# Patient Record
Sex: Female | Born: 1955
Health system: Southern US, Community
[De-identification: ages and names within clinical notes are randomized; demographics above are authoritative.]

## PROBLEM LIST (undated history)

## (undated) DIAGNOSIS — R51 Headache: Secondary | ICD-10-CM

## (undated) DIAGNOSIS — R519 Headache, unspecified: Secondary | ICD-10-CM

## (undated) DIAGNOSIS — M1990 Unspecified osteoarthritis, unspecified site: Secondary | ICD-10-CM

## (undated) DIAGNOSIS — Z809 Family history of malignant neoplasm, unspecified: Secondary | ICD-10-CM

## (undated) DIAGNOSIS — Z808 Family history of malignant neoplasm of other organs or systems: Secondary | ICD-10-CM

## (undated) DIAGNOSIS — M719 Bursopathy, unspecified: Secondary | ICD-10-CM

## (undated) HISTORY — PX: COLONOSCOPY: SHX174

## (undated) HISTORY — PX: AUGMENTATION MAMMAPLASTY: SUR837

## (undated) HISTORY — DX: Family history of malignant neoplasm, unspecified: Z80.9

## (undated) HISTORY — PX: TONSILLECTOMY: SUR1361

## (undated) HISTORY — DX: Family history of malignant neoplasm of other organs or systems: Z80.8

---

## 1983-01-28 HISTORY — PX: BREAST ENHANCEMENT SURGERY: SHX7

## 2008-10-30 ENCOUNTER — Ambulatory Visit: Payer: Self-pay | Admitting: Family Medicine

## 2009-01-27 HISTORY — PX: OTHER SURGICAL HISTORY: SHX169

## 2014-09-08 ENCOUNTER — Ambulatory Visit (INDEPENDENT_AMBULATORY_CARE_PROVIDER_SITE_OTHER): Payer: BC Managed Care – PPO | Admitting: Family Medicine

## 2014-09-08 ENCOUNTER — Encounter: Payer: Self-pay | Admitting: Family Medicine

## 2014-09-08 VITALS — BP 116/55 | HR 84 | Temp 98.6°F | Ht 64.0 in | Wt 127.6 lb

## 2014-09-08 DIAGNOSIS — J329 Chronic sinusitis, unspecified: Secondary | ICD-10-CM | POA: Insufficient documentation

## 2014-09-08 DIAGNOSIS — J209 Acute bronchitis, unspecified: Secondary | ICD-10-CM | POA: Diagnosis not present

## 2014-09-08 DIAGNOSIS — E213 Hyperparathyroidism, unspecified: Secondary | ICD-10-CM | POA: Insufficient documentation

## 2014-09-08 DIAGNOSIS — J45909 Unspecified asthma, uncomplicated: Secondary | ICD-10-CM | POA: Diagnosis not present

## 2014-09-08 MED ORDER — PREDNISONE 5 MG/5ML PO SOLN: 10.0000 mg | Freq: Two times a day (BID) | ORAL | Status: DC

## 2014-09-08 MED ORDER — DOXYCYCLINE CALCIUM 50 MG/5ML PO SYRP: 100.0000 mg | ORAL_SOLUTION | Freq: Two times a day (BID) | ORAL | Status: DC

## 2014-09-08 NOTE — Progress Notes (Signed)
Date:  09/08/2014   Name:  Shelly Thornton   DOB:  [birth year]   MRN:  [id number]  PCP:  Halina Maidens, MD    Chief Complaint: URI   History of Present Illness:  This is a 59 y.o. female with cough productive of yellow phlegm for past 10 days, stuffy nose, ST initially, LG fever yesterday. Gets wheezy with LRI's. Needs liquids as can't swallow pills.  Review of Systems:  Review of Systems  Patient Active Problem List   Diagnosis Date Noted  . Sinus infection 09/08/2014  . HPTH (hyperparathyroidism) 09/08/2014    Prior to Admission medications   Medication Sig Start Date End Date Taking? Authorizing Provider  doxycycline (VIBRAMYCIN) 50 MG/5ML SYRP Take 10 mLs (100 mg total) by mouth 2 (two) times daily. 09/08/14   Adline Potter, MD  predniSONE 5 MG/5ML solution Take 10 mLs (10 mg total) by mouth 2 (two) times daily with a meal. 09/08/14   Adline Potter, MD    Allergies  Allergen Reactions  . Penicillins Rash    Past Surgical History  Procedure Laterality Date  . Parathyroid surgery  2011  . Tonsillectomy      Social History  Substance Use Topics  . Smoking status: Never Smoker   . Smokeless tobacco: Not on file  . Alcohol Use: 0.0 oz/week    0 Standard drinks or equivalent per week    No family history on file.  Medication list has been reviewed and updated.  Physical Examination: BP 116/55 mmHg  Pulse 84  Temp(Src) 98.6 F (37 C)  Ht 5\' 4"  (1.626 m)  Wt 127 lb 9.6 oz (57.879 kg)  BMI 21.89 kg/m2  SpO2 98%  Physical Exam  HENT:  Mouth/Throat: Oropharynx is clear and moist.  Cardiovascular: Normal rate, regular rhythm and normal heart sounds.   Pulmonary/Chest: Effort normal.  B expiratory wheezes, no rales    Assessment and Plan:  1. Bronchitis with asthma, subacute - doxycycline (VIBRAMYCIN) 50 MG/5ML SYRP; Take 10 mLs (100 mg total) by mouth 2 (two) times daily.  Dispense: 200 mL; Refill: 0 - predniSONE 5 MG/5ML solution; Take 10 mLs (10 mg  total) by mouth 2 (two) times daily with a meal.  Dispense: 100 mL; Refill: 0  Return if symptoms worsen or fail to improve.  Satira Anis. [facility]  09/08/2014

## 2015-01-11 ENCOUNTER — Encounter: Payer: Self-pay | Admitting: Internal Medicine

## 2015-02-22 ENCOUNTER — Other Ambulatory Visit: Payer: Self-pay

## 2015-08-08 ENCOUNTER — Other Ambulatory Visit: Payer: Self-pay | Admitting: Internal Medicine

## 2015-08-09 ENCOUNTER — Encounter: Payer: Self-pay | Admitting: Internal Medicine

## 2015-08-09 ENCOUNTER — Ambulatory Visit (INDEPENDENT_AMBULATORY_CARE_PROVIDER_SITE_OTHER): Payer: BC Managed Care – PPO | Admitting: Internal Medicine

## 2015-08-09 VITALS — BP 102/78 | HR 63 | Resp 16 | Ht 64.0 in | Wt 130.5 lb

## 2015-08-09 DIAGNOSIS — M25512 Pain in left shoulder: Secondary | ICD-10-CM | POA: Diagnosis not present

## 2015-08-09 DIAGNOSIS — Z1239 Encounter for other screening for malignant neoplasm of breast: Secondary | ICD-10-CM

## 2015-08-09 DIAGNOSIS — G5603 Carpal tunnel syndrome, bilateral upper limbs: Secondary | ICD-10-CM | POA: Diagnosis not present

## 2015-08-09 NOTE — Progress Notes (Signed)
    Date:  08/09/2015   Name:  Shelly Thornton   DOB:  Feb 28, 1955   MRN:  [id number]   Chief Complaint: Shoulder Pain and Hand Pain Shoulder Pain  The pain is present in the left shoulder. This is a chronic problem. The current episode started more than 1 year ago. The problem has been gradually worsening. The quality of the pain is described as aching. The pain is mild. Associated symptoms include a limited range of motion, numbness (intermittent both wrists) and stiffness. The symptoms are aggravated by lying down and activity. She has tried NSAIDS for the symptoms. The treatment provided moderate relief.  Hand Pain  Associated symptoms include numbness (intermittent both wrists). Pertinent negatives include no chest pain.   Mostly in right wrist - worse with activities, numb at night and often painful.  Grip seems to be decreased.  Left side similar just not as bad.  Splinting helps.   Review of Systems  Constitutional: Negative for chills and fatigue.  Respiratory: Negative for cough, chest tightness and shortness of breath.   Cardiovascular: Negative for chest pain, palpitations and leg swelling.  Musculoskeletal: Positive for myalgias, arthralgias, stiffness and neck stiffness. Negative for joint swelling and gait problem.  Neurological: Positive for numbness (intermittent both wrists).    Patient Active Problem List   Diagnosis Date Noted  . HPTH (hyperparathyroidism) ([place]) 09/08/2014    Prior to Admission medications   Not on File    Allergies  Allergen Reactions  . Penicillins Rash    Past Surgical History  Procedure Laterality Date  . Parathyroid surgery  2011  . Tonsillectomy      Social History  Substance Use Topics  . Smoking status: Never Smoker   . Smokeless tobacco: None  . Alcohol Use: 0.0 oz/week    0 Standard drinks or equivalent per week     Medication list has been reviewed and updated.   Physical Exam  Constitutional: She is oriented to  person, place, and time. She appears well-developed. No distress.  HENT:  Head: Normocephalic and atraumatic.  Cardiovascular: Normal rate, regular rhythm and normal heart sounds.   Pulmonary/Chest: Effort normal and breath sounds normal. No respiratory distress.  Musculoskeletal: She exhibits no edema.       Left shoulder: She exhibits decreased range of motion, tenderness and spasm. She exhibits no effusion and no deformity.  Positive Phalen's sign both wrists  Neurological: She is alert and oriented to person, place, and time. She has normal strength and normal reflexes.  Skin: Skin is warm and dry. No rash noted.  Psychiatric: She has a normal mood and affect. Her behavior is normal. Thought content normal.  Nursing note and vitals reviewed.   BP 102/78 mmHg  Pulse 63  Resp 16  Ht 5\' 4"  (1.626 m)  Wt 130 lb 8 oz (59.194 kg)  BMI 22.39 kg/m2  SpO2 97%  LMP  (Approximate)  Assessment and Plan: 1. Left shoulder pain Continue advil as needed - Ambulatory referral to Orthopedic Surgery  2. Bilateral carpal tunnel syndrome Continue splints as needed Continue modified activities and nsaids Consider referral for cortisone injections  3. Breast cancer screening - MM DIGITAL SCREENING BILATERAL; Future  Pt will schedule CPX.  She is given information regarding cologuard.  Halina Maidens, MD [facility]  08/09/2015

## 2015-08-09 NOTE — Patient Instructions (Signed)
Check on coverage for Cologuard -

## 2015-08-20 ENCOUNTER — Ambulatory Visit: Admission: RE | Admit: 2015-08-20 | Payer: Self-pay | Source: Ambulatory Visit

## 2016-02-07 DIAGNOSIS — G5600 Carpal tunnel syndrome, unspecified upper limb: Secondary | ICD-10-CM | POA: Insufficient documentation

## 2016-02-08 ENCOUNTER — Telehealth: Payer: Self-pay | Admitting: Internal Medicine

## 2016-02-08 ENCOUNTER — Other Ambulatory Visit: Payer: Self-pay | Admitting: Internal Medicine

## 2016-02-08 NOTE — Telephone Encounter (Signed)
Pt called wants to know what type of muscle relaxer prescribe couple years ago.

## 2016-02-08 NOTE — Telephone Encounter (Signed)
Called pt back about the medication Rx. Tizanidine.Marland Kitchen

## 2016-02-08 NOTE — Telephone Encounter (Signed)
The medication was tizanidine 4 mg given in 2013.

## 2016-02-11 ENCOUNTER — Encounter: Payer: Self-pay | Admitting: Internal Medicine

## 2016-02-11 ENCOUNTER — Ambulatory Visit (INDEPENDENT_AMBULATORY_CARE_PROVIDER_SITE_OTHER): Payer: BC Managed Care – PPO | Admitting: Internal Medicine

## 2016-02-11 VITALS — BP 122/84 | HR 57 | Temp 98.7°F | Ht 64.0 in | Wt 140.0 lb

## 2016-02-11 DIAGNOSIS — Z1211 Encounter for screening for malignant neoplasm of colon: Secondary | ICD-10-CM | POA: Diagnosis not present

## 2016-02-11 DIAGNOSIS — B351 Tinea unguium: Secondary | ICD-10-CM | POA: Diagnosis not present

## 2016-02-11 DIAGNOSIS — Z0000 Encounter for general adult medical examination without abnormal findings: Secondary | ICD-10-CM

## 2016-02-11 DIAGNOSIS — Z1159 Encounter for screening for other viral diseases: Secondary | ICD-10-CM | POA: Diagnosis not present

## 2016-02-11 DIAGNOSIS — Z1231 Encounter for screening mammogram for malignant neoplasm of breast: Secondary | ICD-10-CM

## 2016-02-11 DIAGNOSIS — Z1239 Encounter for other screening for malignant neoplasm of breast: Secondary | ICD-10-CM

## 2016-02-11 LAB — POCT URINALYSIS DIPSTICK
BILIRUBIN UA: NEGATIVE
Blood, UA: NEGATIVE
Glucose, UA: NEGATIVE
KETONES UA: NEGATIVE
LEUKOCYTES UA: NEGATIVE
Nitrite, UA: NEGATIVE
Protein, UA: NEGATIVE
Spec Grav, UA: <=1.005
Urobilinogen, UA: 0.2
pH, UA: 6

## 2016-02-11 MED ORDER — CICLOPIROX 8 % EX KIT: 1.0000 "application " | PACK | CUTANEOUS | 5 refills | Status: AC

## 2016-02-11 NOTE — Progress Notes (Addendum)
Date:  02/11/2016   Name:  Shelly Thornton   DOB:  [birth year]   MRN:  [id number]   Chief Complaint: Annual Exam Shelly Thornton is a 61 y.o. female who presents today for her Complete Annual Exam. She feels fairly well. She reports exercising walking. She reports she is sleeping well. She denies breast problems -needs to reschedule mammogram. She has hx of incomplete colonoscopy ~2005.  She has not attempted one since.  This past year her sister was diagnosed with Carcinoid syndrome and is having surgery this month.  She needs to see GI to see what her options are for screening.  Over Christmas she had a day of severe RUQ abd pain with nausea and diarrhea that lasted one day.  It has not recurred.  Review of Systems  Constitutional: Negative for chills, fatigue and fever.  HENT: Negative for congestion, hearing loss, tinnitus, trouble swallowing and voice change.   Eyes: Negative for visual disturbance.  Respiratory: Negative for cough, chest tightness, shortness of breath and wheezing.   Cardiovascular: Negative for chest pain, palpitations and leg swelling.  Gastrointestinal: Negative for abdominal pain, constipation, diarrhea and vomiting.  Endocrine: Negative for polydipsia and polyuria.  Genitourinary: Negative for dysuria, frequency, genital sores, pelvic pain, vaginal bleeding and vaginal discharge.  Musculoskeletal: Positive for back pain. Negative for arthralgias, gait problem and joint swelling.  Skin: Negative for color change and rash.  Neurological: Negative for dizziness, tremors, light-headedness and headaches.  Hematological: Negative for adenopathy. Does not bruise/bleed easily.  Psychiatric/Behavioral: Negative for dysphoric mood and sleep disturbance. The patient is not nervous/anxious.     Patient Active Problem List   Diagnosis Date Noted  . HPTH (hyperparathyroidism) ([place]) 09/08/2014    Prior to Admission medications   Medication Sig Start Date End Date Taking?  Authorizing Provider  meloxicam (MOBIC) 15 MG tablet Take 1 tablet by mouth daily. 01/01/16   Historical Provider, MD  methocarbamol (ROBAXIN) 500 MG tablet  02/07/16   Historical Provider, MD    Allergies  Allergen Reactions  . Penicillins Rash    Past Surgical History:  Procedure Laterality Date  . COLONOSCOPY  ~2005   @ [facility] - incomplete  . parathyroid surgery  2011  . TONSILLECTOMY      Social History  Substance Use Topics  . Smoking status: Never Smoker  . Smokeless tobacco: Never Used  . Alcohol use 0.0 oz/week     Medication list has been reviewed and updated.   Physical Exam  Constitutional: She is oriented to person, place, and time. She appears well-developed and well-nourished. No distress.  HENT:  Head: Normocephalic and atraumatic.  Right Ear: Tympanic membrane and ear canal normal.  Left Ear: Tympanic membrane and ear canal normal.  Nose: Right sinus exhibits no maxillary sinus tenderness. Left sinus exhibits no maxillary sinus tenderness.  Mouth/Throat: Uvula is midline and oropharynx is clear and moist.  Eyes: Conjunctivae and EOM are normal. Right eye exhibits no discharge. Left eye exhibits no discharge. No scleral icterus.  Neck: Normal range of motion. Carotid bruit is not present. No erythema present. No thyromegaly present.  Cardiovascular: Normal rate, regular rhythm, normal heart sounds and normal pulses.   Pulmonary/Chest: Effort normal. No respiratory distress. She has no wheezes. Right breast exhibits no mass, no nipple discharge, no skin change and no tenderness. Left breast exhibits no mass, no nipple discharge, no skin change and no tenderness.  Bilateral breast implants - firm but not hard or tender  Abdominal:  Soft. Bowel sounds are normal. There is no hepatosplenomegaly. There is no tenderness. There is no CVA tenderness.  Genitourinary: Vagina normal and uterus normal. There is no tenderness, lesion or injury on the right labia. There is no  tenderness, lesion or injury on the left labia. Cervix exhibits no motion tenderness and no discharge. Right adnexum displays no mass, no tenderness and no fullness. Left adnexum displays no mass, no tenderness and no fullness.  Musculoskeletal: Normal range of motion.  Lymphadenopathy:    She has no cervical adenopathy.    She has no axillary adenopathy.  Neurological: She is alert and oriented to person, place, and time. She has normal reflexes. No cranial nerve deficit or sensory deficit.  Skin: Skin is warm, dry and intact. No rash noted.  Fungal nail on right great toe  Psychiatric: She has a normal mood and affect. Her speech is normal and behavior is normal. Thought content normal.  Nursing note and vitals reviewed.   BP 122/84   Pulse (!) 57   Temp 98.7 F (37.1 C)   Ht 5\' 4"  (1.626 m)   Wt 140 lb (63.5 kg)   LMP  (Approximate)   SpO2 98%   BMI 24.03 kg/m   Assessment and Plan: 1. Annual physical exam Normal exam - Pap obtained - not need for further Paps unless problems Declines Flu vaccine - CBC with Differential/Platelet - Comprehensive metabolic panel - Lipid panel - TSH - POCT urinalysis dipstick - Pap IG and HPV (high risk) DNA detection  2. Breast cancer screening reschedule - MM DIGITAL SCREENING BILATERAL; Future  3. Need for hepatitis C screening test - Hepatitis C antibody  4. Colon cancer screening - Ambulatory referral to Gastroenterology   Halina Maidens, MD [facility]  02/11/2016

## 2016-02-11 NOTE — Addendum Note (Signed)
Addended by: Glean Hess on: 02/11/2016 09:52 AM   Modules accepted: Orders

## 2016-02-11 NOTE — Patient Instructions (Addendum)
1. What is shingles? Shingles is a painful skin rash, often with blisters. It is also called Herpes Zoster, or just Zoster. A shingles rash usually appears on one side of the face or body and lasts from 2 to 4 weeks. Its main symptom is pain, which can be quite severe. Other symptoms of shingles can include fever, headache, chills and upset stomach. Very rarely, a shingles infection can lead to pneumonia, hearing problems, blindness, brain inflammation (encephalitis) or death. For about 1 person in 5, severe pain can continue even long after the rash clears up. This is called post-herpetic neuralgia. Shingles is caused by the Varicella Zoster virus, the same virus that causes chickenpox. Only someone who has had chickenpox-or, rarely, has gotten chickenpox vaccine-can get shingles. The virus stays in your body, and can cause shingles many years later. You can't catch shingles from another person with shingles. However, a person who has never had chickenpox (or chicken pox vaccine) could get chickenpox from someone with shingles. This is not very common. Shingles is far more common in people 37 years of age and older than in younger people. It is also more common in people whose immune systems are weakened because of a disease such as cancer, or drugs such as steroids or chemotherapy. At least 1 million people a year in the [place] get shingles. 2. Shingles vaccine A vaccine for shingles was licensed in 0947. In clinical trials, the vaccine reduced the risk of shingles by 50%. It can also reduce pain in people who still get shingles after being vaccinated. A single dose of shingles vaccine is recommended for adults 23 years of age and older. 3. Some people should not get shingles vaccine or should wait A person should not get shingles vaccine who:  has ever had a life-threatening allergic reaction to gelatin, the antibiotic neomycin, or any other component of shingles vaccine. Tell your doctor if  you have any severe allergies.  has a weakened immune system because of current:  AIDS or another disease that affects the immune system,  treatment with drugs that affect the immune system, such as prolonged use of high-dose steroids,  cancer treatment such as radiation or chemotherapy,  cancer affecting the bone marrow or lymphatic system, such as leukemia or lymphoma.  is pregnant, or might be pregnant. Women should not become pregnant until at least 4 weeks after getting shingles vaccine. Someone with a minor acute illness, such as a cold, may be vaccinated. But anyone with a moderate or severe acute illness should usually wait until they recover before get ting the vaccine. This includes anyone with a temperature of 101.3 F or higher. 4. What are the risks from shingles vaccine? A vaccine, like any medicine, could possibly cause serious problems, such as severe allergic reactions. However, the risk of a vaccine causing serious harm, or death, is extremely small. No serious problems have been identified with shingles vaccine. Mild problems  Redness, soreness, swelling, or itching at the site of the injection (about 1 person in 3).  Headache (about 1 person in 68). Like all vaccines, shingles vaccine is being closely monitored for unusual or severe problems. 5. What if there is a serious reaction? What should I look for?  Look for anything that concerns you, such as signs of a severe allergic reaction, very high fever, or behavior changes. Signs of a severe allergic reaction can include hives, swelling of the face and throat, difficulty breathing, a fast heartbeat, dizziness, and weakness. These  would start a few minutes to a few hours after the vaccination. What should I do?  If you think it is a severe allergic reaction or other emergency that can't wait, call 9-1-1 or get the person to the nearest hospital. Otherwise, call your doctor.  Afterward, the reaction should be  reported to the Vaccine Adverse Event Reporting System (VAERS). Your doctor might file this report, or you can do it yourself through the VAERS web site at www.vaers.SamedayNews.es or by calling 574-409-2547. VAERS is only for reporting reactions. They do not give medical advice. 6. How can I learn more?  Ask your doctor.  Call your local or state health department.  Contact the Centers for Disease Control and Prevention (CDC):  Call 808-850-4978(1-800-CDC-INFO) or  Visit CDC's website at http://hunter.com/ CDC Vaccine Information Statement (VIS) Shingles Vaccine (11/02/2007) This information is not intended to replace advice given to you by your health care provider. Make sure you discuss any questions you have with your health care provider. Document Released: 11/10/2005 Document Revised: 09/12/2015 Document Reviewed: 05/05/2012 Elsevier Interactive Patient Education  2017 [employer].

## 2016-02-12 LAB — COMPREHENSIVE METABOLIC PANEL
ALBUMIN: 4.6 g/dL (ref 3.6–4.8)
ALT: 19 IU/L (ref 0–32)
AST: 20 IU/L (ref 0–40)
Albumin/Globulin Ratio: 1.9 (ref 1.2–2.2)
Alkaline Phosphatase: 63 IU/L (ref 39–117)
BUN / CREAT RATIO: 21 (ref 12–28)
BUN: 18 mg/dL (ref 8–27)
Bilirubin Total: 0.6 mg/dL (ref 0.0–1.2)
CALCIUM: 9.4 mg/dL (ref 8.7–10.3)
CO2: 28 mmol/L (ref 18–29)
CREATININE: 0.84 mg/dL (ref 0.57–1.00)
Chloride: 97 mmol/L (ref 96–106)
GFR, EST AFRICAN AMERICAN: 86 mL/min/{1.73_m2} (ref >59)
GFR, EST NON AFRICAN AMERICAN: 75 mL/min/{1.73_m2} (ref >59)
GLOBULIN, TOTAL: 2.4 g/dL (ref 1.5–4.5)
Glucose: 88 mg/dL (ref 65–99)
Potassium: 4.3 mmol/L (ref 3.5–5.2)
SODIUM: 140 mmol/L (ref 134–144)
Total Protein: 7 g/dL (ref 6.0–8.5)

## 2016-02-12 LAB — LIPID PANEL
CHOL/HDL RATIO: 2.5 ratio (ref 0.0–4.4)
Cholesterol, Total: 259 mg/dL — ABNORMAL HIGH (ref 100–199)
HDL: 105 mg/dL (ref >39)
LDL CALC: 133 mg/dL — AB (ref 0–99)
Triglycerides: 103 mg/dL (ref 0–149)
VLDL Cholesterol Cal: 21 mg/dL (ref 5–40)

## 2016-02-12 LAB — CBC WITH DIFFERENTIAL/PLATELET
BASOS: 1 %
Basophils Absolute: 0 10*3/uL (ref 0.0–0.2)
EOS (ABSOLUTE): 0.1 10*3/uL (ref 0.0–0.4)
EOS: 1 %
HEMATOCRIT: 40 % (ref 34.0–46.6)
Hemoglobin: 13.6 g/dL (ref 11.1–15.9)
IMMATURE GRANULOCYTES: 0 %
Immature Grans (Abs): 0 10*3/uL (ref 0.0–0.1)
LYMPHS ABS: 2.4 10*3/uL (ref 0.7–3.1)
Lymphs: 33 %
MCH: 31.3 pg (ref 26.6–33.0)
MCHC: 34 g/dL (ref 31.5–35.7)
MCV: 92 fL (ref 79–97)
MONOCYTES: 11 %
MONOS ABS: 0.8 10*3/uL (ref 0.1–0.9)
Neutrophils Absolute: 4.1 10*3/uL (ref 1.4–7.0)
Neutrophils: 54 %
Platelets: 267 10*3/uL (ref 150–379)
RBC: 4.35 x10E6/uL (ref 3.77–5.28)
RDW: 13.8 % (ref 12.3–15.4)
WBC: 7.4 10*3/uL (ref 3.4–10.8)

## 2016-02-12 LAB — HEPATITIS C ANTIBODY: Hep C Virus Ab: <0.1 s/co ratio (ref 0.0–0.9)

## 2016-02-12 LAB — TSH: TSH: 3.49 u[IU]/mL (ref 0.450–4.500)

## 2016-02-13 LAB — PAP IG AND HPV HIGH-RISK
HPV, high-risk: POSITIVE — AB
PAP Smear Comment: 0

## 2016-02-14 ENCOUNTER — Encounter: Payer: Self-pay | Admitting: Internal Medicine

## 2016-02-14 DIAGNOSIS — B977 Papillomavirus as the cause of diseases classified elsewhere: Secondary | ICD-10-CM | POA: Insufficient documentation

## 2016-02-28 ENCOUNTER — Ambulatory Visit: Payer: BC Managed Care – PPO | Admitting: Gastroenterology

## 2016-02-28 ENCOUNTER — Ambulatory Visit (INDEPENDENT_AMBULATORY_CARE_PROVIDER_SITE_OTHER): Payer: BC Managed Care – PPO | Admitting: Gastroenterology

## 2016-02-28 ENCOUNTER — Encounter: Payer: Self-pay | Admitting: Gastroenterology

## 2016-02-28 ENCOUNTER — Other Ambulatory Visit: Payer: Self-pay

## 2016-02-28 VITALS — BP 125/65 | HR 66 | Temp 98.1°F | Ht 64.0 in | Wt 142.0 lb

## 2016-02-28 DIAGNOSIS — K580 Irritable bowel syndrome with diarrhea: Secondary | ICD-10-CM

## 2016-02-28 NOTE — Progress Notes (Signed)
Gastroenterology Consultation  Referring Provider:     Glean Hess, MD Primary Care Physician:  Halina Maidens, MD Primary Gastroenterologist:  Dr. Allen Norris     Reason for Consultation:     Change in bowel habits        HPI:   Shelly Thornton is a 61 y.o. y/o female referred for consultation & management of Change in bowel habits by Dr. Halina Maidens, MD.  This patient comes today with a long-standing history of bloating with abdominal discomfort and diarrhea. The patient states that she has had 2 attempts at colonoscopy in the past. The patient's first attempt was under no sedation of the patient told them to stop because it was too painful. The patient then underwent a colonoscopy with Versed and Demerol and the extends could not be reached and she was told it was because she has irritable bowel syndrome. The patient has never had a colonoscopy since. The patient does have a sister with metastatic carcinoid cancer. She has recently visited her sister who has had surgery to remove the primary tumor but has liver metastases. She reports that she has episodes of having normal bowel movements then will have attacks of diarrhea with abdominal pain and bloating. She thought it may be her gallbladder but states that it was not made better or worse with any foods.  History reviewed. No pertinent past medical history.  Past Surgical History:  Procedure Laterality Date  . BREAST ENHANCEMENT SURGERY  1985  . COLONOSCOPY  ~2005   @ [facility] - incomplete  . parathyroid surgery  2011  . TONSILLECTOMY      Prior to Admission medications   Medication Sig Start Date End Date Taking? Authorizing Provider  ciclopirox (PENLAC) 8 % solution apply topically for 1 DAY 02/12/16  Yes Historical Provider, MD  meloxicam (MOBIC) 15 MG tablet  01/01/16   Historical Provider, MD    Family History  Problem Relation Age of Onset  . Thyroid cancer Mother   . CAD Father 62  . Cancer Sister     carcinoid of  intestine     Social History  Substance Use Topics  . Smoking status: Never Smoker  . Smokeless tobacco: Never Used  . Alcohol use 0.0 oz/week    Allergies as of 02/28/2016 - Review Complete 02/28/2016  Allergen Reaction Noted  . Penicillins Rash 09/08/2014    Review of Systems:    All systems reviewed and negative except where noted in HPI.   Physical Exam:  BP 125/65   Pulse 66   Temp 98.1 F (36.7 C) (Oral)   Ht 5\' 4"  (1.626 m)   Wt 142 lb (64.4 kg)   BMI 24.37 kg/m  No LMP recorded. Patient is postmenopausal. Psych:  Alert and cooperative. Normal mood and affect. General:   Alert,  Well-developed, well-nourished, pleasant and cooperative in NAD Head:  Normocephalic and atraumatic. Eyes:  Sclera clear, no icterus.   Conjunctiva pink. Ears:  Normal auditory acuity. Nose:  No deformity, discharge, or lesions. Mouth:  No deformity or lesions,oropharynx pink & moist. Neck:  Supple; no masses or thyromegaly. Lungs:  Respirations even and unlabored.  Clear throughout to auscultation.   No wheezes, crackles, or rhonchi. No acute distress. Heart:  Regular rate and rhythm; no murmurs, clicks, rubs, or gallops. Abdomen:  Normal bowel sounds.  No bruits.  Soft, non-tender and non-distended without masses, hepatosplenomegaly or hernias noted.  No guarding or rebound tenderness.  Negative Carnett sign.  Rectal:  Deferred.  Msk:  Symmetrical without gross deformities.  Good, equal movement & strength bilaterally. Pulses:  Normal pulses noted. Extremities:  No clubbing or edema.  No cyanosis. Neurologic:  Alert and oriented x3;  grossly normal neurologically. Skin:  Intact without significant lesions or rashes.  No jaundice. Lymph Nodes:  No significant cervical adenopathy. Psych:  Alert and cooperative. Normal mood and affect.  Imaging Studies: No results found.  Assessment and Plan:   Shelly Thornton is a 61 y.o. y/o female who comes in today with a history of irritable  bowel syndrome and symptoms consistent with that. The patient has been told to increase fiber in her diet. This has been a long-standing problem and we have discussed its etiology and treatment. The patient also has never had a complete screening colonoscopy and will be set up for a colonoscopy. I have discussed risks & benefits which include, but are not limited to, bleeding, infection, perforation & drug reaction.  The patient agrees with this plan & written consent will be obtained.       Lucilla Lame, MD. [facility]   Note: This dictation was prepared with Dragon dictation along with smaller phrase technology. Any transcriptional errors that result from this process are unintentional.

## 2016-03-04 NOTE — Discharge Instructions (Signed)

## 2016-03-06 ENCOUNTER — Encounter
Admission: RE | Disposition: A | Discharge status: Home / Self Care | Payer: Self-pay | Source: Ambulatory Visit | Attending: Gastroenterology

## 2016-03-06 ENCOUNTER — Ambulatory Visit: Payer: BC Managed Care – PPO | Admitting: Anesthesiology

## 2016-03-06 ENCOUNTER — Ambulatory Visit
Admission: RE | Admit: 2016-03-06 | Discharge: 2016-03-06 | Disposition: A | Discharge status: Home / Self Care | Payer: BC Managed Care – PPO | Source: Ambulatory Visit | Attending: Gastroenterology | Admitting: Gastroenterology

## 2016-03-06 DIAGNOSIS — D124 Benign neoplasm of descending colon: Secondary | ICD-10-CM | POA: Insufficient documentation

## 2016-03-06 DIAGNOSIS — R51 Headache: Secondary | ICD-10-CM | POA: Diagnosis not present

## 2016-03-06 DIAGNOSIS — K640 First degree hemorrhoids: Secondary | ICD-10-CM | POA: Diagnosis not present

## 2016-03-06 DIAGNOSIS — D123 Benign neoplasm of transverse colon: Secondary | ICD-10-CM | POA: Insufficient documentation

## 2016-03-06 DIAGNOSIS — K5730 Diverticulosis of large intestine without perforation or abscess without bleeding: Secondary | ICD-10-CM | POA: Insufficient documentation

## 2016-03-06 DIAGNOSIS — Z87891 Personal history of nicotine dependence: Secondary | ICD-10-CM | POA: Insufficient documentation

## 2016-03-06 DIAGNOSIS — Z1211 Encounter for screening for malignant neoplasm of colon: Secondary | ICD-10-CM | POA: Diagnosis not present

## 2016-03-06 HISTORY — PX: POLYPECTOMY: SHX149

## 2016-03-06 HISTORY — DX: Headache, unspecified: R51.9

## 2016-03-06 HISTORY — DX: Headache: R51

## 2016-03-06 HISTORY — PX: COLONOSCOPY WITH PROPOFOL: SHX5780

## 2016-03-06 HISTORY — DX: Bursopathy, unspecified: M71.9

## 2016-03-06 SURGERY — COLONOSCOPY WITH PROPOFOL
Anesthesia: Monitor Anesthesia Care | Wound class: Contaminated

## 2016-03-06 MED ORDER — ACETAMINOPHEN 325 MG PO TABS: 325.0000 mg | ORAL_TABLET | ORAL | Status: DC | PRN

## 2016-03-06 MED ORDER — LACTATED RINGERS IV SOLN
INTRAVENOUS | Status: DC
  Administered 2016-03-06: 08:00:00 via INTRAVENOUS

## 2016-03-06 MED ORDER — EPHEDRINE SULFATE 50 MG/ML IJ SOLN
INTRAMUSCULAR | Status: DC | PRN
  Administered 2016-03-06: 5 mg via INTRAVENOUS

## 2016-03-06 MED ORDER — PROPOFOL 10 MG/ML IV BOLUS
INTRAVENOUS | Status: DC | PRN
  Administered 2016-03-06 (×2): 30 mg via INTRAVENOUS
  Administered 2016-03-06: 40 mg via INTRAVENOUS
  Administered 2016-03-06: 100 mg via INTRAVENOUS

## 2016-03-06 MED ORDER — LIDOCAINE HCL (CARDIAC) 20 MG/ML IV SOLN
INTRAVENOUS | Status: DC | PRN
  Administered 2016-03-06: 50 mg via INTRAVENOUS

## 2016-03-06 MED ORDER — ACETAMINOPHEN 160 MG/5ML PO SOLN: 325.0000 mg | ORAL | Status: DC | PRN

## 2016-03-06 SURGICAL SUPPLY — 23 items

## 2016-03-06 NOTE — Op Note (Signed)
[facility] Patient Name: Shelly Thornton Procedure Date: 03/06/2016 8:18 AM MRN: [id number] Account #: [id number] Date of Birth: [birth year] Admit Type: Outpatient Age: 61 Room: [facility] OR ROOM 01 Gender: Female Note Status: Finalized Procedure:            Colonoscopy Indications:          Screening for colorectal malignant neoplasm Providers:            Lucilla Lame MD, MD Referring MD:         Halina Maidens, MD (Referring MD) Medicines:            Propofol per Anesthesia Complications:        No immediate complications. Procedure:            Pre-Anesthesia Assessment:                       - Prior to the procedure, a History and Physical was                        performed, and patient medications and allergies were                        reviewed. The patient's tolerance of previous                        anesthesia was also reviewed. The risks and benefits of                        the procedure and the sedation options and risks were                        discussed with the patient. All questions were                        answered, and informed consent was obtained. Prior                        Anticoagulants: The patient has taken no previous                        anticoagulant or antiplatelet agents. ASA Grade                        Assessment: II - A patient with mild systemic disease.                        After reviewing the risks and benefits, the patient was                        deemed in satisfactory condition to undergo the                        procedure.                       After obtaining informed consent, the colonoscope was                        passed under direct vision. Throughout the procedure,  the patient's blood pressure, pulse, and oxygen                        saturations were monitored continuously. The Olympus                        PCF H180AL Colonoscope (S#: [id number]) was introduced                     through the anus and advanced to the the cecum,                        identified by appendiceal orifice and ileocecal valve.                        The colonoscopy was performed without difficulty. The                        patient tolerated the procedure well. The quality of                        the bowel preparation was excellent. Findings:      The perianal and digital rectal examinations were normal.      A 4 mm polyp was found in the transverse colon. The polyp was sessile.       The polyp was removed with a cold biopsy forceps. Resection and       retrieval were complete.      Multiple small-mouthed diverticula were found in the sigmoid colon.      Non-bleeding internal hemorrhoids were found during retroflexion. The       hemorrhoids were Grade I (internal hemorrhoids that do not prolapse).      A 3 mm polyp was found in the descending colon. The polyp was sessile.       The polyp was removed with a cold biopsy forceps. Resection and       retrieval were complete. Impression:           - One 4 mm polyp in the transverse colon, removed with                        a cold biopsy forceps. Resected and retrieved.                       - Diverticulosis in the sigmoid colon.                       - Non-bleeding internal hemorrhoids.                       - One 3 mm polyp in the descending colon, removed with                        a cold biopsy forceps. Resected and retrieved. Recommendation:       - Discharge patient to home.                       - Resume previous diet.                       - Continue present medications.                       -  Await pathology results.                       - Repeat colonoscopy in 5 years if polyp adenoma and 10                        years if hyperplastic Procedure Code(s):    --- Professional ---                       [phone], Colonoscopy, flexible; with biopsy, single or                        multiple Diagnosis Code(s):     --- Professional ---                       Z12.11, Encounter for screening for malignant neoplasm                        of colon                       D12.3, Benign neoplasm of transverse colon (hepatic                        flexure or splenic flexure) CPT copyright 2016 American Medical Association. All rights reserved. The codes documented in this report are preliminary and upon coder review may  be revised to meet current compliance requirements. Lucilla Lame MD, MD 03/06/2016 8:46:53 AM This report has been signed electronically. Number of Addenda: 0 Note Initiated On: 03/06/2016 8:18 AM Scope Withdrawal Time: 0 hours 6 minutes 3 seconds  Total Procedure Duration: 0 hours 11 minutes 19 seconds       [facility]

## 2016-03-06 NOTE — Anesthesia Procedure Notes (Signed)
Procedure Name: MAC Date/Time: 03/06/2016 8:26 AM Performed by: Cameron Ali Pre-anesthesia Checklist: Patient identified, Emergency Drugs available, Suction available, Timeout performed and Patient being monitored Patient Re-evaluated:Patient Re-evaluated prior to inductionOxygen Delivery Method: Nasal cannula Placement Confirmation: positive ETCO2

## 2016-03-06 NOTE — H&P (Signed)
  Shelly Lame, MD [facility] [address], [place], [place] [address] Phone: [phone] Fax : [phone]  Primary Care Physician:  Halina Maidens, MD Primary Gastroenterologist:  Dr. Allen Norris  Pre-Procedure History & Physical: HPI:  Shelly Thornton is a 61 y.o. female is here for a screening colonoscopy.   Past Medical History:  Diagnosis Date  . Bursitis    left shoulder and right wrist  . Headache    occasional due muscle in shoulders possible migraine    Past Surgical History:  Procedure Laterality Date  . BREAST ENHANCEMENT SURGERY  1985  . COLONOSCOPY  ~2005   @ [facility] - incomplete  . parathyroid surgery  2011  . TONSILLECTOMY      Prior to Admission medications   Medication Sig Start Date End Date Taking? Authorizing Provider  ciclopirox (PENLAC) 8 % solution apply topically for 1 DAY 02/12/16   Historical Provider, MD  meloxicam (MOBIC) 15 MG tablet  01/01/16   Historical Provider, MD    Allergies as of 02/28/2016 - Review Complete 02/28/2016  Allergen Reaction Noted  . Penicillins Rash 09/08/2014    Family History  Problem Relation Age of Onset  . Thyroid cancer Mother   . CAD Father 37  . Cancer Sister     carcinoid of intestine    Social History   Social History  . Marital status: Married    Spouse name: N/A  . Number of children: N/A  . Years of education: N/A   Occupational History  . Not on file.   Social History Main Topics  . Smoking status: Former Smoker    Packs/day: 1.00    Years: 10.00    Types: Cigarettes  . Smokeless tobacco: Never Used  . Alcohol use 0.0 oz/week     Comment: socially  . Drug use: No  . Sexual activity: Not on file   Other Topics Concern  . Not on file   Social History Narrative  . No narrative on file    Review of Systems: See HPI, otherwise negative ROS  Physical Exam: BP 117/67   Pulse 77   Temp 97.3 F (36.3 C) (Temporal)   Ht 5\' 4"  (1.626 m)   Wt 133 lb (60.3 kg)   SpO2 98%   BMI 22.83 kg/m   General:   Alert,  pleasant and cooperative in NAD Head:  Normocephalic and atraumatic. Neck:  Supple; no masses or thyromegaly. Lungs:  Clear throughout to auscultation.    Heart:  Regular rate and rhythm. Abdomen:  Soft, nontender and nondistended. Normal bowel sounds, without guarding, and without rebound.   Neurologic:  Alert and  oriented x4;  grossly normal neurologically.  Impression/Plan: Shelly Thornton is now here to undergo a screening colonoscopy.  Risks, benefits, and alternatives regarding colonoscopy have been reviewed with the patient.  Questions have been answered.  All parties agreeable.

## 2016-03-06 NOTE — Anesthesia Postprocedure Evaluation (Signed)
Anesthesia Post Note  Patient: Shelly Thornton  Procedure(s) Performed: Procedure(s) (LRB): COLONOSCOPY WITH biopsy (N/A) POLYPECTOMY INTESTINAL  Patient location during evaluation: PACU Anesthesia Type: MAC Level of consciousness: awake and alert Pain management: pain level controlled Vital Signs Assessment: post-procedure vital signs reviewed and stable Respiratory status: spontaneous breathing, nonlabored ventilation, respiratory function stable and patient connected to nasal cannula oxygen Cardiovascular status: stable and blood pressure returned to baseline Anesthetic complications: no    Trecia Rogers

## 2016-03-06 NOTE — Transfer of Care (Signed)
Immediate Anesthesia Transfer of Care Note  Patient: Shelly Thornton  Procedure(s) Performed: Procedure(s) with comments: COLONOSCOPY WITH biopsy (N/A) POLYPECTOMY INTESTINAL - Transverse colon polyp  Patient Location: PACU  Anesthesia Type: MAC  Level of Consciousness: awake, alert  and patient cooperative  Airway and Oxygen Therapy: Patient Spontanous Breathing and Patient connected to supplemental oxygen  Post-op Assessment: Post-op Vital signs reviewed, Patient's Cardiovascular Status Stable, Respiratory Function Stable, Patent Airway and No signs of Nausea or vomiting  Post-op Vital Signs: Reviewed and stable  Complications: No apparent anesthesia complications

## 2016-03-06 NOTE — Anesthesia Preprocedure Evaluation (Signed)
Anesthesia Evaluation  Patient identified by MRN, date of birth, ID band Patient awake    Reviewed: Allergy & Precautions, H&P , NPO status , Patient's Chart, lab work & pertinent test results, reviewed documented beta blocker date and time   Airway Mallampati: II  TM Distance: >3 FB Neck ROM: full    Dental no notable dental hx.    Pulmonary former smoker,    Pulmonary exam normal breath sounds clear to auscultation       Cardiovascular Exercise Tolerance: Good negative cardio ROS Normal cardiovascular exam Rhythm:regular Rate:Normal     Neuro/Psych  Headaches, negative psych ROS   GI/Hepatic negative GI ROS, Neg liver ROS,   Endo/Other  negative endocrine ROS  Renal/GU negative Renal ROS  negative genitourinary   Musculoskeletal   Abdominal   Peds  Hematology negative hematology ROS (+)   Anesthesia Other Findings   Reproductive/Obstetrics negative OB ROS                             Anesthesia Physical Anesthesia Plan  ASA: I  Anesthesia Plan: MAC   Post-op Pain Management:    Induction:   Airway Management Planned:   Additional Equipment:   Intra-op Plan:   Post-operative Plan:   Informed Consent: I have reviewed the patients History and Physical, chart, labs and discussed the procedure including the risks, benefits and alternatives for the proposed anesthesia with the patient or authorized representative who has indicated his/her understanding and acceptance.   Dental Advisory Given  Plan Discussed with: CRNA  Anesthesia Plan Comments:         Anesthesia Quick Evaluation

## 2016-03-07 ENCOUNTER — Encounter: Payer: Self-pay | Admitting: Gastroenterology

## 2016-03-09 ENCOUNTER — Encounter: Payer: Self-pay | Admitting: Gastroenterology

## 2016-03-10 ENCOUNTER — Encounter: Payer: Self-pay | Admitting: Internal Medicine

## 2016-03-10 DIAGNOSIS — D126 Benign neoplasm of colon, unspecified: Secondary | ICD-10-CM | POA: Insufficient documentation

## 2016-09-17 ENCOUNTER — Ambulatory Visit: Payer: BC Managed Care – PPO | Admitting: Podiatry

## 2017-01-28 ENCOUNTER — Other Ambulatory Visit: Payer: Self-pay | Admitting: Internal Medicine

## 2017-01-28 ENCOUNTER — Ambulatory Visit: Payer: BC Managed Care – PPO | Admitting: Internal Medicine

## 2017-01-28 ENCOUNTER — Encounter: Payer: Self-pay | Admitting: Internal Medicine

## 2017-01-28 VITALS — BP 112/64 | HR 72 | Temp 97.7°F | Ht 64.0 in | Wt 140.0 lb

## 2017-01-28 DIAGNOSIS — J40 Bronchitis, not specified as acute or chronic: Secondary | ICD-10-CM | POA: Diagnosis not present

## 2017-01-28 MED ORDER — DOXYCYCLINE HYCLATE 100 MG PO TABS: 100.0000 mg | ORAL_TABLET | Freq: Two times a day (BID) | ORAL | 0 refills | Status: AC

## 2017-01-28 MED ORDER — ALBUTEROL SULFATE HFA 108 (90 BASE) MCG/ACT IN AERS: 2.0000 | INHALATION_SPRAY | Freq: Four times a day (QID) | RESPIRATORY_TRACT | 0 refills | Status: DC | PRN

## 2017-01-28 NOTE — Progress Notes (Signed)
Date:  01/28/2017   Name:  Shelly Thornton   DOB:  [birth year]   MRN:  [id number]   Chief Complaint: Generalized Body Aches (Started Christmas- started with cough- some on and off green mucous. Overall body aches, headaches,and fever yesterday. ) and Breast Mass (Left Armpit- noticed 2 days ago. Sore when coughing. )  Cough  This is a new problem. The current episode started in the past 7 days. The problem has been unchanged. The problem occurs every few minutes. The cough is productive of sputum. Associated symptoms include shortness of breath and wheezing. Pertinent negatives include no chest pain, chills, fever, headaches or sore throat. She has tried a beta-agonist inhaler and OTC cough suppressant for the symptoms. The treatment provided moderate relief.   Axillary lump - tender under left arm - mostly with cough but thinks there might be a lump.   Review of Systems  Constitutional: Negative for chills and fever.  HENT: Positive for sinus pressure. Negative for congestion, sore throat and trouble swallowing.   Respiratory: Positive for cough, shortness of breath and wheezing.   Cardiovascular: Negative for chest pain, palpitations and leg swelling.  Gastrointestinal: Negative for diarrhea, nausea and vomiting.  Musculoskeletal: Negative for arthralgias.  Neurological: Negative for dizziness, light-headedness and headaches.  Psychiatric/Behavioral: Negative for sleep disturbance.    Patient Active Problem List   Diagnosis Date Noted  . Adenomatous colon polyp 03/10/2016  . HPV (human papilloma virus) infection 02/14/2016  . HPTH (hyperparathyroidism) ([place]) 09/08/2014    Prior to Admission medications   Medication Sig Start Date End Date Taking? Authorizing Provider  albuterol (PROVENTIL HFA;VENTOLIN HFA) 108 (90 Base) MCG/ACT inhaler Inhale into the lungs every 6 (six) hours as needed for wheezing or shortness of breath.   Yes [provider]  meloxicam (MOBIC) 15 MG  tablet  01/01/16  Yes [provider]    Allergies  Allergen Reactions  . Penicillins Rash    Past Surgical History:  Procedure Laterality Date  . BREAST ENHANCEMENT SURGERY  1985  . COLONOSCOPY  ~2005   @ [facility] - incomplete  . COLONOSCOPY WITH PROPOFOL N/A 03/06/2016   Procedure: COLONOSCOPY WITH biopsy;  Surgeon: Lucilla Lame, MD;  Location: [place];  Service: Endoscopy;  Laterality: N/A;  . parathyroid surgery  2011  . POLYPECTOMY  03/06/2016   Procedure: POLYPECTOMY INTESTINAL;  Surgeon: Lucilla Lame, MD;  Location: [place];  Service: Endoscopy;;  Transverse colon polyp  . TONSILLECTOMY      Social History   Tobacco Use  . Smoking status: Former Smoker    Packs/day: 1.00    Years: 10.00    Pack years: 10.00    Types: Cigarettes  . Smokeless tobacco: Never Used  Substance Use Topics  . Alcohol use: Yes    Alcohol/week: 0.0 oz    Comment: socially  . Drug use: No     Medication list has been reviewed and updated.  PHQ 2/9 Scores 02/11/2016  PHQ - 2 Score 0    Physical Exam  Constitutional: She is oriented to person, place, and time. She appears well-developed. No distress.  HENT:  Head: Normocephalic and atraumatic.  Cardiovascular: Normal rate, regular rhythm and normal heart sounds.  Pulmonary/Chest: Effort normal. No respiratory distress. She has no decreased breath sounds (bronchial breath sounds both upper lungs). She has no wheezes. She has no rhonchi.  Musculoskeletal: Normal range of motion.  Lymphadenopathy:    She has no cervical adenopathy.  She has no axillary adenopathy.  Neurological: She is alert and oriented to person, place, and time.  Skin: Skin is warm and dry. No rash noted.  Psychiatric: She has a normal mood and affect. Her behavior is normal. Thought content normal.  Nursing note and vitals reviewed.   BP 112/64   Pulse 72   Temp 97.7 F (36.5 C) (Oral)   Ht 5\' 4"  (1.626 m)   Wt 140 lb (63.5 kg)   SpO2  97%   BMI 24.03 kg/m   Assessment and Plan: 1. Bronchitis Continue Nyquil as needed, fluids - albuterol (PROVENTIL HFA;VENTOLIN HFA) 108 (90 Base) MCG/ACT inhaler; Inhale 2 puffs into the lungs every 6 (six) hours as needed for wheezing or shortness of breath.  Dispense: 1 Inhaler; Refill: 0 - doxycycline (VIBRA-TABS) 100 MG tablet; Take 1 tablet (100 mg total) by mouth 2 (two) times daily for 10 days.  Dispense: 20 tablet; Refill: 0   Meds ordered this encounter  Medications  . albuterol (PROVENTIL HFA;VENTOLIN HFA) 108 (90 Base) MCG/ACT inhaler    Sig: Inhale 2 puffs into the lungs every 6 (six) hours as needed for wheezing or shortness of breath.    Dispense:  1 Inhaler    Refill:  0  . doxycycline (VIBRA-TABS) 100 MG tablet    Sig: Take 1 tablet (100 mg total) by mouth 2 (two) times daily for 10 days.    Dispense:  20 tablet    Refill:  0    Partially dictated using Editor, commissioning. Any errors are unintentional.  Halina Maidens, MD [facility]  01/28/2017

## 2017-02-04 ENCOUNTER — Other Ambulatory Visit: Payer: Self-pay | Admitting: Internal Medicine

## 2017-02-04 DIAGNOSIS — Z1231 Encounter for screening mammogram for malignant neoplasm of breast: Secondary | ICD-10-CM

## 2017-02-12 ENCOUNTER — Ambulatory Visit
Admission: RE | Admit: 2017-02-12 | Discharge: 2017-02-12 | Disposition: A | Discharge status: Home / Self Care | Payer: BC Managed Care – PPO | Source: Ambulatory Visit | Attending: Internal Medicine | Admitting: Internal Medicine

## 2017-02-12 ENCOUNTER — Other Ambulatory Visit: Payer: Self-pay | Admitting: Internal Medicine

## 2017-02-12 DIAGNOSIS — Z1231 Encounter for screening mammogram for malignant neoplasm of breast: Secondary | ICD-10-CM | POA: Insufficient documentation

## 2017-06-25 ENCOUNTER — Ambulatory Visit: Payer: BC Managed Care – PPO | Admitting: Internal Medicine

## 2017-06-25 ENCOUNTER — Encounter: Payer: Self-pay | Admitting: Internal Medicine

## 2017-06-25 VITALS — BP 100/60 | HR 97 | Temp 98.2°F | Resp 16 | Ht 64.0 in | Wt 142.2 lb

## 2017-06-25 DIAGNOSIS — Z23 Encounter for immunization: Secondary | ICD-10-CM | POA: Diagnosis not present

## 2017-06-25 DIAGNOSIS — S61210A Laceration without foreign body of right index finger without damage to nail, initial encounter: Secondary | ICD-10-CM

## 2017-06-25 DIAGNOSIS — L03011 Cellulitis of right finger: Secondary | ICD-10-CM

## 2017-06-25 MED ORDER — CEPHALEXIN 500 MG PO CAPS: 500.0000 mg | ORAL_CAPSULE | Freq: Four times a day (QID) | ORAL | 0 refills | Status: AC

## 2017-06-25 NOTE — Progress Notes (Signed)
Date:  06/25/2017   Name:  Shelly Thornton   DOB:  18-Aug-1955   MRN:  [id number]   Chief Complaint: Wound Infection (right index finger, X 1.5 weeks ) Laceration   The incident occurred more than 1 week ago. The laceration is located on the right hand (index finger PIP joint - on cheese grater 06/10/17). The laceration mechanism was a metal edge. The pain is mild. She reports no foreign bodies present.      Review of Systems  Constitutional: Negative for chills, fatigue and fever.  Respiratory: Negative for chest tightness and shortness of breath.   Cardiovascular: Negative for chest pain.  Musculoskeletal: Positive for arthralgias (hard to bend the index finger due to swelling).  Skin: Positive for color change and wound.    Patient Active Problem List   Diagnosis Date Noted  . Adenomatous colon polyp 03/10/2016  . HPV (human papilloma virus) infection 02/14/2016  . HPTH (hyperparathyroidism) ([place]) 09/08/2014    Prior to Admission medications   Medication Sig Start Date End Date Taking? Authorizing Provider  albuterol (PROVENTIL HFA;VENTOLIN HFA) 108 (90 Base) MCG/ACT inhaler Inhale 2 puffs into the lungs every 6 (six) hours as needed for wheezing or shortness of breath. 01/28/17  Yes Glean Hess, MD  Aspirin-Caffeine [facility] FAST PAIN RELIEF PO) Take by mouth.   Yes [provider]    Allergies  Allergen Reactions  . Penicillins Rash    Past Surgical History:  Procedure Laterality Date  . AUGMENTATION MAMMAPLASTY    . BREAST ENHANCEMENT SURGERY  1985  . COLONOSCOPY  ~2005   @ [facility] - incomplete  . COLONOSCOPY WITH PROPOFOL N/A 03/06/2016   Procedure: COLONOSCOPY WITH biopsy;  Surgeon: Lucilla Lame, MD;  Location: [place];  Service: Endoscopy;  Laterality: N/A;  . parathyroid surgery  2011  . POLYPECTOMY  03/06/2016   Procedure: POLYPECTOMY INTESTINAL;  Surgeon: Lucilla Lame, MD;  Location: [place];  Service: Endoscopy;;  Transverse colon  polyp  . TONSILLECTOMY      Social History   Tobacco Use  . Smoking status: Former Smoker    Packs/day: 1.00    Years: 10.00    Pack years: 10.00    Types: Cigarettes  . Smokeless tobacco: Never Used  Substance Use Topics  . Alcohol use: Yes    Alcohol/week: 0.0 oz    Comment: socially  . Drug use: No     Medication list has been reviewed and updated.  Current Meds  Medication Sig  . albuterol (PROVENTIL HFA;VENTOLIN HFA) 108 (90 Base) MCG/ACT inhaler Inhale 2 puffs into the lungs every 6 (six) hours as needed for wheezing or shortness of breath.  . Aspirin-Caffeine (BC FAST PAIN RELIEF PO) Take by mouth.    PHQ 2/9 Scores 02/11/2016  PHQ - 2 Score 0    Physical Exam  Constitutional: She is oriented to person, place, and time. She appears well-developed. No distress.  HENT:  Head: Normocephalic and atraumatic.  Pulmonary/Chest: Effort normal. No respiratory distress.  Musculoskeletal: Normal range of motion.  Neurological: She is alert and oriented to person, place, and time.  Skin: Skin is warm and dry. No rash noted.     Psychiatric: She has a normal mood and affect. Her behavior is normal. Thought content normal.  Nursing note and vitals reviewed.   BP 100/60   Pulse 97   Temp 98.2 F (36.8 C) (Oral)   Resp 16   Ht 5\' 4"  (1.626 m)  Wt 142 lb 3.2 oz (64.5 kg)   SpO2 (!) 72%   BMI 24.41 kg/m   Assessment and Plan: 1. Cellulitis of finger of right hand Keep area covered to avoid further trauma - cephALEXin (KEFLEX) 500 MG capsule; Take 1 capsule (500 mg total) by mouth 4 (four) times daily for 10 days.  Dispense: 40 capsule; Refill: 0  2. Laceration of right index finger without foreign body without damage to nail, initial encounter Last documented tetanus 2003 - Tdap vaccine greater than or equal to 7yo IM   Meds ordered this encounter  Medications  . cephALEXin (KEFLEX) 500 MG capsule    Sig: Take 1 capsule (500 mg total) by mouth 4 (four)  times daily for 10 days.    Dispense:  40 capsule    Refill:  0    Partially dictated using Editor, commissioning. Any errors are unintentional.  Halina Maidens, MD [facility]  06/25/2017   There are no diagnoses linked to this encounter.

## 2017-06-25 NOTE — Patient Instructions (Signed)
Tdap Vaccine (Tetanus, Diphtheria and Pertussis): What You Need to Know 1. Why get vaccinated? Tetanus, diphtheria and pertussis are very serious diseases. Tdap vaccine can protect us from these diseases. And, Tdap vaccine given to pregnant women can protect newborn babies against pertussis. TETANUS (Lockjaw) is rare in the [place] today. It causes painful muscle tightening and stiffness, usually all over the body.  It can lead to tightening of muscles in the head and neck so you can't open your mouth, swallow, or sometimes even breathe. Tetanus kills about 1 out of 10 people who are infected even after receiving the best medical care.  DIPHTHERIA is also rare in the [place] today. It can cause a thick coating to form in the back of the throat.  It can lead to breathing problems, heart failure, paralysis, and death.  PERTUSSIS (Whooping Cough) causes severe coughing spells, which can cause difficulty breathing, vomiting and disturbed sleep.  It can also lead to weight loss, incontinence, and rib fractures. Up to 2 in 100 adolescents and 5 in 100 adults with pertussis are hospitalized or have complications, which could include pneumonia or death.  These diseases are caused by bacteria. Diphtheria and pertussis are spread from person to person through secretions from coughing or sneezing. Tetanus enters the body through cuts, scratches, or wounds. Before vaccines, as many as 200,000 cases of diphtheria, 200,000 cases of pertussis, and hundreds of cases of tetanus, were reported in the [place] each year. Since vaccination began, reports of cases for tetanus and diphtheria have dropped by about 99% and for pertussis by about 80%. 2. Tdap vaccine Tdap vaccine can protect adolescents and adults from tetanus, diphtheria, and pertussis. One dose of Tdap is routinely given at age 11 or 12. People who did not get Tdap at that age should get it as soon as possible. Tdap is especially  important for healthcare professionals and anyone having close contact with a baby younger than 12 months. Pregnant women should get a dose of Tdap during every pregnancy, to protect the newborn from pertussis. Infants are most at risk for severe, life-threatening complications from pertussis. Another vaccine, called Td, protects against tetanus and diphtheria, but not pertussis. A Td booster should be given every 10 years. Tdap may be given as one of these boosters if you have never gotten Tdap before. Tdap may also be given after a severe cut or burn to prevent tetanus infection. Your doctor or the person giving you the vaccine can give you more information. Tdap may safely be given at the same time as other vaccines. 3. Some people should not get this vaccine  A person who has ever had a life-threatening allergic reaction after a previous dose of any diphtheria, tetanus or pertussis containing vaccine, OR has a severe allergy to any part of this vaccine, should not get Tdap vaccine. Tell the person giving the vaccine about any severe allergies.  Anyone who had coma or long repeated seizures within 7 days after a childhood dose of DTP or DTaP, or a previous dose of Tdap, should not get Tdap, unless a cause other than the vaccine was found. They can still get Td.  Talk to your doctor if you: ? have seizures or another nervous system problem, ? had severe pain or swelling after any vaccine containing diphtheria, tetanus or pertussis, ? ever had a condition called Guillain-Barr Syndrome (GBS), ? aren't feeling well on the day the shot is scheduled. 4. Risks With any medicine, including   vaccines, there is a chance of side effects. These are usually mild and go away on their own. Serious reactions are also possible but are rare. Most people who get Tdap vaccine do not have any problems with it. Mild problems following Tdap: (Did not interfere with activities)  Pain where the shot was given (about  3 in 4 adolescents or 2 in 3 adults)  Redness or swelling where the shot was given (about 1 person in 5)  Mild fever of at least 100.4F (up to about 1 in 25 adolescents or 1 in 100 adults)  Headache (about 3 or 4 people in 10)  Tiredness (about 1 person in 3 or 4)  Nausea, vomiting, diarrhea, stomach ache (up to 1 in 4 adolescents or 1 in 10 adults)  Chills, sore joints (about 1 person in 10)  Body aches (about 1 person in 3 or 4)  Rash, swollen glands (uncommon)  Moderate problems following Tdap: (Interfered with activities, but did not require medical attention)  Pain where the shot was given (up to 1 in 5 or 6)  Redness or swelling where the shot was given (up to about 1 in 16 adolescents or 1 in 12 adults)  Fever over 102F (about 1 in 100 adolescents or 1 in 250 adults)  Headache (about 1 in 7 adolescents or 1 in 10 adults)  Nausea, vomiting, diarrhea, stomach ache (up to 1 or 3 people in 100)  Swelling of the entire arm where the shot was given (up to about 1 in 500).  Severe problems following Tdap: (Unable to perform usual activities; required medical attention)  Swelling, severe pain, bleeding and redness in the arm where the shot was given (rare).  Problems that could happen after any vaccine:  People sometimes faint after a medical procedure, including vaccination. Sitting or lying down for about 15 minutes can help prevent fainting, and injuries caused by a fall. Tell your doctor if you feel dizzy, or have vision changes or ringing in the ears.  Some people get severe pain in the shoulder and have difficulty moving the arm where a shot was given. This happens very rarely.  Any medication can cause a severe allergic reaction. Such reactions from a vaccine are very rare, estimated at fewer than 1 in a million doses, and would happen within a few minutes to a few hours after the vaccination. As with any medicine, there is a very remote chance of a vaccine  causing a serious injury or death. The safety of vaccines is always being monitored. For more information, visit: www.cdc.gov/vaccinesafety/ 5. What if there is a serious problem? What should I look for? Look for anything that concerns you, such as signs of a severe allergic reaction, very high fever, or unusual behavior. Signs of a severe allergic reaction can include hives, swelling of the face and throat, difficulty breathing, a fast heartbeat, dizziness, and weakness. These would usually start a few minutes to a few hours after the vaccination. What should I do?  If you think it is a severe allergic reaction or other emergency that can't wait, call 9-1-1 or get the person to the nearest hospital. Otherwise, call your doctor.  Afterward, the reaction should be reported to the Vaccine Adverse Event Reporting System (VAERS). Your doctor might file this report, or you can do it yourself through the VAERS web site at www.vaers.hhs.gov, or by calling 1-800-822-7967. ? VAERS does not give medical advice. 6. The National Vaccine Injury Compensation Program The National   Vaccine Injury Compensation Program (VICP) is a federal program that was created to compensate people who may have been injured by certain vaccines. Persons who believe they may have been injured by a vaccine can learn about the program and about filing a claim by calling 1-800-338-2382 or visiting the VICP website at www.hrsa.gov/vaccinecompensation. There is a time limit to file a claim for compensation. 7. How can I learn more?  Ask your doctor. He or she can give you the vaccine package insert or suggest other sources of information.  Call your local or state health department.  Contact the Centers for Disease Control and Prevention (CDC): ? Call 1-800-232-4636 (1-800-CDC-INFO) or ? Visit CDC's website at www.cdc.gov/vaccines CDC Tdap Vaccine VIS (03/22/13) This information is not intended to replace advice given to you by your  health care provider. Make sure you discuss any questions you have with your health care provider. Document Released: 07/15/2011 Document Revised: 10/04/2015 Document Reviewed: 10/04/2015 Elsevier Interactive Patient Education  2017 Elsevier Inc.  

## 2017-08-25 ENCOUNTER — Encounter: Payer: Self-pay | Admitting: Internal Medicine

## 2017-08-25 ENCOUNTER — Ambulatory Visit: Payer: BC Managed Care – PPO | Admitting: Internal Medicine

## 2017-08-25 VITALS — BP 124/80 | HR 86 | Temp 98.2°F | Resp 16 | Ht 64.0 in | Wt 136.6 lb

## 2017-08-25 DIAGNOSIS — J40 Bronchitis, not specified as acute or chronic: Secondary | ICD-10-CM

## 2017-08-25 MED ORDER — DOXYCYCLINE HYCLATE 100 MG PO TABS: 100.0000 mg | ORAL_TABLET | Freq: Two times a day (BID) | ORAL | 0 refills | Status: AC

## 2017-08-25 MED ORDER — GUAIFENESIN-CODEINE 100-10 MG/5ML PO SYRP: 5.0000 mL | ORAL_SOLUTION | Freq: Three times a day (TID) | ORAL | 0 refills | Status: AC | PRN

## 2017-08-25 NOTE — Patient Instructions (Signed)
Take Mucinex twice a day 

## 2017-08-25 NOTE — Progress Notes (Signed)
Date:  08/25/2017   Name:  Shelly Thornton   DOB:  [birth year]   MRN:  [id number]   Chief Complaint: Cough (Started 10 days ago. Coughing- green/yellow production. ) Cough  This is a new problem. The current episode started 1 to 4 weeks ago (10 days ago - started as a cold and now settled chest). The problem has been gradually worsening. The problem occurs every few minutes. The cough is productive of sputum. Associated symptoms include shortness of breath, sweats and wheezing. Pertinent negatives include no chills, ear congestion, ear pain, headaches, heartburn or postnasal drip. She has tried a beta-agonist inhaler for the symptoms.  She has a hx of smoking and frequent bronchitis after URI.      Review of Systems  Constitutional: Positive for diaphoresis. Negative for chills, fatigue and unexpected weight change.  HENT: Negative for ear discharge, ear pain, postnasal drip, sinus pressure and trouble swallowing.   Respiratory: Positive for cough, chest tightness, shortness of breath and wheezing.   Cardiovascular: Negative for palpitations.  Gastrointestinal: Negative for diarrhea, heartburn, nausea and vomiting.  Neurological: Negative for dizziness and headaches.  Psychiatric/Behavioral: Positive for sleep disturbance.    Patient Active Problem List   Diagnosis Date Noted  . Adenomatous colon polyp 03/10/2016  . HPV (human papilloma virus) infection 02/14/2016  . HPTH (hyperparathyroidism) ([place]) 09/08/2014    Prior to Admission medications   Medication Sig Start Date End Date Taking? Authorizing Provider  albuterol (PROVENTIL HFA;VENTOLIN HFA) 108 (90 Base) MCG/ACT inhaler Inhale 2 puffs into the lungs every 6 (six) hours as needed for wheezing or shortness of breath. 01/28/17   Glean Hess, MD  Aspirin-Caffeine [facility] FAST PAIN RELIEF PO) Take by mouth.    [provider]    Allergies  Allergen Reactions  . Penicillins Rash    Past Surgical History:  Procedure  Laterality Date  . AUGMENTATION MAMMAPLASTY    . BREAST ENHANCEMENT SURGERY  1985  . COLONOSCOPY  ~2005   @ [facility] - incomplete  . COLONOSCOPY WITH PROPOFOL N/A 03/06/2016   Procedure: COLONOSCOPY WITH biopsy;  Surgeon: Lucilla Lame, MD;  Location: [place];  Service: Endoscopy;  Laterality: N/A;  . parathyroid surgery  2011  . POLYPECTOMY  03/06/2016   Procedure: POLYPECTOMY INTESTINAL;  Surgeon: Lucilla Lame, MD;  Location: [place];  Service: Endoscopy;;  Transverse colon polyp  . TONSILLECTOMY      Social History   Tobacco Use  . Smoking status: Former Smoker    Packs/day: 1.00    Years: 10.00    Pack years: 10.00    Types: Cigarettes  . Smokeless tobacco: Never Used  Substance Use Topics  . Alcohol use: Yes    Alcohol/week: 0.0 oz    Comment: socially  . Drug use: No     Medication list has been reviewed and updated.  Current Meds  Medication Sig  . albuterol (PROVENTIL HFA;VENTOLIN HFA) 108 (90 Base) MCG/ACT inhaler Inhale 2 puffs into the lungs every 6 (six) hours as needed for wheezing or shortness of breath.  . Aspirin-Caffeine (BC FAST PAIN RELIEF PO) Take by mouth.    PHQ 2/9 Scores 06/25/2017 02/11/2016  PHQ - 2 Score 0 0  PHQ- 9 Score 0 -    Physical Exam  Constitutional: She is oriented to person, place, and time. She appears well-developed. No distress.  HENT:  Head: Normocephalic and atraumatic.  Right Ear: Hearing, tympanic membrane and ear canal normal.  Left Ear:  Hearing, tympanic membrane and ear canal normal.  Nose: Right sinus exhibits no maxillary sinus tenderness. Left sinus exhibits no maxillary sinus tenderness.  Mouth/Throat: Oropharynx is clear and moist.  Cardiovascular: Normal rate, regular rhythm and normal heart sounds.  Pulmonary/Chest: Effort normal. No respiratory distress. She has wheezes in the right upper field. She has no rhonchi. She has no rales.  Musculoskeletal: Normal range of motion.  Neurological: She is  alert and oriented to person, place, and time.  Skin: Skin is warm and dry. No rash noted.  Psychiatric: She has a normal mood and affect. Her behavior is normal. Thought content normal.  Nursing note and vitals reviewed.   BP 124/80   Pulse 86   Temp 98.2 F (36.8 C) (Oral)   Resp 16   Ht 5\' 4"  (1.626 m)   Wt 136 lb 9.6 oz (62 kg)   SpO2 99%   BMI 23.45 kg/m   Assessment and Plan: 1. Bronchitis Increase fluids, take otc mucinex, use albuterol qid prn - doxycycline (VIBRA-TABS) 100 MG tablet; Take 1 tablet (100 mg total) by mouth 2 (two) times daily for 10 days.  Dispense: 20 tablet; Refill: 0 - guaiFENesin-codeine (ROBITUSSIN AC) 100-10 MG/5ML syrup; Take 5 mLs by mouth 3 (three) times daily as needed for up to 10 days for cough.  Dispense: 150 mL; Refill: 0   Meds ordered this encounter  Medications  . doxycycline (VIBRA-TABS) 100 MG tablet    Sig: Take 1 tablet (100 mg total) by mouth 2 (two) times daily for 10 days.    Dispense:  20 tablet    Refill:  0  . guaiFENesin-codeine (ROBITUSSIN AC) 100-10 MG/5ML syrup    Sig: Take 5 mLs by mouth 3 (three) times daily as needed for up to 10 days for cough.    Dispense:  150 mL    Refill:  0    Partially dictated using Editor, commissioning. Any errors are unintentional.  Halina Maidens, MD [facility]  08/25/2017

## 2017-08-29 ENCOUNTER — Encounter: Payer: Self-pay | Admitting: Emergency Medicine

## 2017-08-29 ENCOUNTER — Other Ambulatory Visit: Payer: Self-pay

## 2017-08-29 ENCOUNTER — Ambulatory Visit
Admission: EM | Admit: 2017-08-29 | Discharge: 2017-08-29 | Disposition: A | Discharge status: Home / Self Care | Payer: BC Managed Care – PPO | Attending: Family Medicine | Admitting: Family Medicine

## 2017-08-29 ENCOUNTER — Ambulatory Visit: Payer: BC Managed Care – PPO

## 2017-08-29 DIAGNOSIS — R062 Wheezing: Secondary | ICD-10-CM | POA: Diagnosis not present

## 2017-08-29 DIAGNOSIS — R05 Cough: Secondary | ICD-10-CM

## 2017-08-29 DIAGNOSIS — J40 Bronchitis, not specified as acute or chronic: Secondary | ICD-10-CM | POA: Diagnosis not present

## 2017-08-29 MED ORDER — HYDROCOD POLST-CPM POLST ER 10-8 MG/5ML PO SUER: 5.0000 mL | Freq: Every evening | ORAL | 0 refills | Status: DC | PRN

## 2017-08-29 MED ORDER — DEXAMETHASONE SODIUM PHOSPHATE 10 MG/ML IJ SOLN
10.0000 mg | Freq: Once | INTRAMUSCULAR | Status: AC
  Administered 2017-08-29: 10 mg via INTRAMUSCULAR

## 2017-08-29 MED ORDER — PREDNISONE 20 MG PO TABS: 40.0000 mg | ORAL_TABLET | Freq: Every day | ORAL | 0 refills | Status: DC

## 2017-08-29 NOTE — ED Provider Notes (Signed)
[facility]    CSN: [id number] Arrival date & time: 08/29/17  0944     History   Chief Complaint Chief Complaint  Patient presents with  . Cough    HPI Shelly Thornton is a 62 y.o. female presents to the urgent care facility for evaluation of cough x3 weeks.  Patient states 3 weeks ago she developed cough and cold symptoms and 5 days ago was diagnosed with bronchitis.  She was given albuterol and doxycycline and states she has not seen much improvement.  She denies any fevers chills or night sweats.  She feels like she has some shortness of breath due to her coughing.  Coughing is still severe.  She has been using albuterol as prescribed.  She is taking Robitussin with codeine.  Patient denies any nausea vomiting or abdominal pain.  No travel outside the country.  No skin rashes. HPI  Past Medical History:  Diagnosis Date  . Bursitis    left shoulder and right wrist  . Headache    occasional due muscle in shoulders possible migraine    Patient Active Problem List   Diagnosis Date Noted  . Adenomatous colon polyp 03/10/2016  . HPV (human papilloma virus) infection 02/14/2016  . HPTH (hyperparathyroidism) ([place]) 09/08/2014    Past Surgical History:  Procedure Laterality Date  . AUGMENTATION MAMMAPLASTY    . BREAST ENHANCEMENT SURGERY  1985  . COLONOSCOPY  ~2005   @ [facility] - incomplete  . COLONOSCOPY WITH PROPOFOL N/A 03/06/2016   Procedure: COLONOSCOPY WITH biopsy;  Surgeon: Lucilla Lame, MD;  Location: [place];  Service: Endoscopy;  Laterality: N/A;  . parathyroid surgery  2011  . POLYPECTOMY  03/06/2016   Procedure: POLYPECTOMY INTESTINAL;  Surgeon: Lucilla Lame, MD;  Location: [place];  Service: Endoscopy;;  Transverse colon polyp  . TONSILLECTOMY      OB History   None      Home Medications    Prior to Admission medications   Medication Sig Start Date End Date Taking? Authorizing Provider  albuterol (PROVENTIL HFA;VENTOLIN HFA) 108  (90 Base) MCG/ACT inhaler Inhale 2 puffs into the lungs every 6 (six) hours as needed for wheezing or shortness of breath. 01/28/17  Yes Glean Hess, MD  Aspirin-Caffeine [facility] FAST PAIN RELIEF PO) Take by mouth.   Yes [provider]  doxycycline (VIBRA-TABS) 100 MG tablet Take 1 tablet (100 mg total) by mouth 2 (two) times daily for 10 days. 08/25/17 09/04/17 Yes Glean Hess, MD  guaiFENesin-codeine [facility]) 100-10 MG/5ML syrup Take 5 mLs by mouth 3 (three) times daily as needed for up to 10 days for cough. 08/25/17 09/04/17 Yes Glean Hess, MD  chlorpheniramine-HYDROcodone [facility] PENNKINETIC ER) 10-8 MG/5ML SUER Take 5 mLs by mouth at bedtime as needed for cough. 08/29/17   Duanne Guess, PA-C  predniSONE (DELTASONE) 20 MG tablet Take 2 tablets (40 mg total) by mouth daily. 08/29/17   Duanne Guess, PA-C    Family History Family History  Problem Relation Age of Onset  . Thyroid cancer Mother   . CAD Father 65  . Cancer Sister        carcinoid of intestine  . Breast cancer Neg Hx     Social History Social History   Tobacco Use  . Smoking status: Former Smoker    Packs/day: 1.00    Years: 10.00    Pack years: 10.00    Types: Cigarettes  . Smokeless tobacco: Never Used  Substance Use Topics  . Alcohol use: Yes    Alcohol/week: 0.0 oz    Comment: socially  . Drug use: No     Allergies   Penicillins   Review of Systems Review of Systems  Constitutional: Negative for fever.  HENT: Negative for congestion, ear discharge, rhinorrhea, sinus pressure, sinus pain, sore throat, trouble swallowing and voice change.   Respiratory: Positive for cough and wheezing. Negative for shortness of breath and stridor.   Cardiovascular: Negative for chest pain.  Gastrointestinal: Negative for abdominal pain, diarrhea, nausea and vomiting.  Genitourinary: Negative for dysuria, flank pain and pelvic pain.  Musculoskeletal: Negative for back pain and myalgias.    Skin: Negative for rash.  Neurological: Negative for dizziness and headaches.     Physical Exam Triage Vital Signs ED Triage Vitals  Enc Vitals Group     BP 08/29/17 0952 119/62     Pulse Rate 08/29/17 0952 70     Resp 08/29/17 0952 14     Temp 08/29/17 0952 98.3 F (36.8 C)     Temp Source 08/29/17 0952 Oral     SpO2 08/29/17 0952 98 %     Weight 08/29/17 0950 138 lb (62.6 kg)     Height 08/29/17 0950 5\' 4"  (1.626 m)     Head Circumference --      Peak Flow --      Pain Score 08/29/17 0950 0     Pain Loc --      Pain Edu? --      Excl. in [place]? --    No data found.  Updated Vital Signs BP 119/62 (BP Location: Left Arm)   Pulse 70   Temp 98.3 F (36.8 C) (Oral)   Resp 14   Ht 5\' 4"  (1.626 m)   Wt 138 lb (62.6 kg)   SpO2 98%   BMI 23.69 kg/m   Visual Acuity Right Eye Distance:   Left Eye Distance:   Bilateral Distance:    Right Eye Near:   Left Eye Near:    Bilateral Near:     Physical Exam  Constitutional: She is oriented to person, place, and time. She appears well-developed and well-nourished. No distress.  HENT:  Head: Normocephalic and atraumatic.  Right Ear: Hearing, tympanic membrane, external ear and ear canal normal.  Left Ear: Hearing, tympanic membrane, external ear and ear canal normal.  Nose: Rhinorrhea present.  Mouth/Throat: Mucous membranes are normal. No trismus in the jaw. No uvula swelling. Posterior oropharyngeal erythema present. No oropharyngeal exudate, posterior oropharyngeal edema or tonsillar abscesses. No tonsillar exudate.  Eyes: Conjunctivae are normal. Right eye exhibits no discharge. Left eye exhibits no discharge.  Neck: Normal range of motion.  Cardiovascular: Normal rate and regular rhythm.  Pulmonary/Chest: Effort normal and breath sounds normal. No stridor. No respiratory distress. She has no wheezes. She has no rales.  Musculoskeletal: Normal range of motion. She exhibits no deformity.  Lymphadenopathy:    She has no  cervical adenopathy.  Neurological: She is alert and oriented to person, place, and time. She has normal reflexes.  Skin: Skin is warm and dry.  Psychiatric: She has a normal mood and affect. Her behavior is normal. Thought content normal.     UC Treatments / Results  Labs (all labs ordered are listed, but only abnormal results are displayed) Labs Reviewed - No data to display  EKG None  Radiology Dg Chest 2 View  Result Date: 08/29/2017 CLINICAL DATA:  Cough for 3  weeks.  Shortness of breath. EXAM: CHEST - 2 VIEW COMPARISON:  None. FINDINGS: The cardiomediastinal silhouette is within normal limits. Calcified breast implants are noted. The lungs are hyperinflated with mild biapical pleural thickening. No airspace consolidation, edema, pleural effusion, or pneumothorax is identified. There is slight S-shaped thoracic scoliosis with slight reversal of the normal thoracic kyphosis. Aortic atherosclerosis is noted. IMPRESSION: Hyperinflation without evidence of acute cardiopulmonary process. Electronically Signed   By: Logan Bores M.D.   On: 08/29/2017 10:36    Procedures Procedures (including critical care time)  Medications Ordered in UC Medications  dexamethasone (DECADRON) injection 10 mg (10 mg Intramuscular Given 08/29/17 1014)    Initial Impression / Assessment and Plan / UC Course  I have reviewed the triage vital signs and the nursing notes.  Pertinent labs & imaging results that were available during my care of the patient were reviewed by me and considered in my medical decision making (see chart for details).     62 year old female with bronchitis.  She is been using albuterol and Robitussin.  Not seeing much relief.  Chest x-ray showed no signs of pneumonia.  Patient given 10 mg dexamethasone IM to help with inflammation and started on prednisone, 40 mg a day for 5 days.  She will continue with albuterol and she is educated on signs and symptoms return to clinic for. Final  Clinical Impressions(s) / UC Diagnoses   Final diagnoses:  Bronchitis     Discharge Instructions     Please continue with albuterol.  Use Tussionex at nighttime as needed for cough.  Take prednisone as prescribed for 5 days.  If any fevers increasing cough, shortness of breath please return to the clinic.    ED Prescriptions    Medication Sig Dispense Auth. Provider   predniSONE (DELTASONE) 20 MG tablet Take 2 tablets (40 mg total) by mouth daily. 10 tablet Duanne Guess, PA-C   chlorpheniramine-HYDROcodone (TUSSIONEX PENNKINETIC ER) 10-8 MG/5ML SUER Take 5 mLs by mouth at bedtime as needed for cough. 60 mL Renata Caprice       Duanne Guess, [place] 08/29/17 1042

## 2017-08-29 NOTE — Discharge Instructions (Addendum)
Please continue with albuterol.  Use Tussionex at nighttime as needed for cough.  Take prednisone as prescribed for 5 days.  If any fevers increasing cough, shortness of breath please return to the clinic.

## 2017-08-29 NOTE — ED Triage Notes (Signed)
Patient was treated for Bronchitis on July 30 and states that her cough has not improved.

## 2018-09-25 ENCOUNTER — Encounter: Payer: Self-pay | Admitting: Emergency Medicine

## 2018-09-25 ENCOUNTER — Other Ambulatory Visit: Payer: Self-pay

## 2018-09-25 ENCOUNTER — Emergency Department
Admission: EM | Admit: 2018-09-25 | Discharge: 2018-09-25 | Disposition: A | Discharge status: Home / Self Care | Payer: Medicare Other | Attending: Emergency Medicine | Admitting: Emergency Medicine

## 2018-09-25 DIAGNOSIS — S0502XA Injury of conjunctiva and corneal abrasion without foreign body, left eye, initial encounter: Secondary | ICD-10-CM

## 2018-09-25 DIAGNOSIS — S058X2A Other injuries of left eye and orbit, initial encounter: Secondary | ICD-10-CM | POA: Diagnosis present

## 2018-09-25 DIAGNOSIS — Z87891 Personal history of nicotine dependence: Secondary | ICD-10-CM | POA: Diagnosis not present

## 2018-09-25 DIAGNOSIS — X58XXXA Exposure to other specified factors, initial encounter: Secondary | ICD-10-CM | POA: Insufficient documentation

## 2018-09-25 DIAGNOSIS — Y999 Unspecified external cause status: Secondary | ICD-10-CM | POA: Insufficient documentation

## 2018-09-25 DIAGNOSIS — Z79899 Other long term (current) drug therapy: Secondary | ICD-10-CM | POA: Diagnosis not present

## 2018-09-25 DIAGNOSIS — Y939 Activity, unspecified: Secondary | ICD-10-CM | POA: Diagnosis not present

## 2018-09-25 DIAGNOSIS — Y929 Unspecified place or not applicable: Secondary | ICD-10-CM | POA: Insufficient documentation

## 2018-09-25 DIAGNOSIS — Z7982 Long term (current) use of aspirin: Secondary | ICD-10-CM | POA: Insufficient documentation

## 2018-09-25 DIAGNOSIS — E213 Hyperparathyroidism, unspecified: Secondary | ICD-10-CM | POA: Diagnosis not present

## 2018-09-25 MED ORDER — TETRACAINE HCL 0.5 % OP SOLN
2.0000 [drp] | Freq: Once | OPHTHALMIC | Status: AC
  Administered 2018-09-25: 2 [drp] via OPHTHALMIC
  Filled 2018-09-25: qty 4

## 2018-09-25 MED ORDER — GENTAMICIN SULFATE 0.3 % OP SOLN: 1.0000 [drp] | OPHTHALMIC | 0 refills | Status: DC

## 2018-09-25 MED ORDER — FLUORESCEIN SODIUM 1 MG OP STRP
1.0000 | ORAL_STRIP | Freq: Once | OPHTHALMIC | Status: AC
  Administered 2018-09-25: 1 via OPHTHALMIC
  Filled 2018-09-25: qty 1

## 2018-09-25 NOTE — ED Provider Notes (Signed)
[facility] Emergency Department Provider Note   ____________________________________________   First MD Initiated Contact with Patient 09/25/18 [phone]     (approximate)  I have reviewed the triage vital signs and the nursing notes.   HISTORY  Chief Complaint Eye Pain    HPI Shelly Thornton is a 63 y.o. female patient complaining of left eye pain upon a.m. today awakening.  Patient states she feels like there is a foreign body in eye.  Patient the pain increase this morning.  Patient denies vision loss.  Patient states she is light sensitive.  Patient rates pain as a 8/10.  Patient described pain as "achy".  No palliative measures for complaint.         Past Medical History:  Diagnosis Date  . Bursitis    left shoulder and right wrist  . Headache    occasional due muscle in shoulders possible migraine    Patient Active Problem List   Diagnosis Date Noted  . Adenomatous colon polyp 03/10/2016  . HPV (human papilloma virus) infection 02/14/2016  . HPTH (hyperparathyroidism) ([place]) 09/08/2014    Past Surgical History:  Procedure Laterality Date  . AUGMENTATION MAMMAPLASTY    . BREAST ENHANCEMENT SURGERY  1985  . COLONOSCOPY  ~2005   @ [facility] - incomplete  . COLONOSCOPY WITH PROPOFOL N/A 03/06/2016   Procedure: COLONOSCOPY WITH biopsy;  Surgeon: Lucilla Lame, MD;  Location: [place];  Service: Endoscopy;  Laterality: N/A;  . parathyroid surgery  2011  . POLYPECTOMY  03/06/2016   Procedure: POLYPECTOMY INTESTINAL;  Surgeon: Lucilla Lame, MD;  Location: [place];  Service: Endoscopy;;  Transverse colon polyp  . TONSILLECTOMY      Prior to Admission medications   Medication Sig Start Date End Date Taking? Authorizing Provider  albuterol (PROVENTIL HFA;VENTOLIN HFA) 108 (90 Base) MCG/ACT inhaler Inhale 2 puffs into the lungs every 6 (six) hours as needed for wheezing or shortness of breath. 01/28/17   Glean Hess, MD   Aspirin-Caffeine [facility] FAST PAIN RELIEF PO) Take by mouth.    [provider]  chlorpheniramine-HYDROcodone (TUSSIONEX PENNKINETIC ER) 10-8 MG/5ML SUER Take 5 mLs by mouth at bedtime as needed for cough. 08/29/17   Duanne Guess, PA-C  gentamicin (GARAMYCIN) 0.3 % ophthalmic solution Place 1 drop into the left eye every 4 (four) hours. 09/25/18   Sable Feil, PA-C  predniSONE (DELTASONE) 20 MG tablet Take 2 tablets (40 mg total) by mouth daily. 08/29/17   Duanne Guess, PA-C    Allergies Penicillins  Family History  Problem Relation Age of Onset  . Thyroid cancer Mother   . CAD Father 6  . Cancer Sister        carcinoid of intestine  . Breast cancer Neg Hx     Social History Social History   Tobacco Use  . Smoking status: Former Smoker    Packs/day: 1.00    Years: 10.00    Pack years: 10.00    Types: Cigarettes  . Smokeless tobacco: Never Used  Substance Use Topics  . Alcohol use: Yes    Alcohol/week: 0.0 standard drinks    Comment: socially  . Drug use: No    Review of Systems  Constitutional: No fever/chills Eyes: No visual changes.  Foreign body sensation photophobia. ENT: No sore throat. Cardiovascular: Denies chest pain. Respiratory: Denies shortness of breath. Gastrointestinal: No abdominal pain.  No nausea, no vomiting.  No diarrhea.  No constipation. Genitourinary: Negative for dysuria.  Musculoskeletal: Negative for back pain. Skin: Negative for rash. Neurological: Negative for headaches, focal weakness or numbness. Allergic/Immunilogical: Penicillin ____________________________________________   PHYSICAL EXAM:  VITAL SIGNS: ED Triage Vitals  Enc Vitals Group     BP 09/25/18 0410 131/63     Pulse Rate 09/25/18 0410 82     Resp 09/25/18 0410 18     Temp 09/25/18 0410 98.5 F (36.9 C)     Temp Source 09/25/18 0410 Oral     SpO2 09/25/18 0410 98 %     Weight 09/25/18 0411 145 lb (65.8 kg)     Height 09/25/18 0411 5\' 4"  (1.626 m)      Head Circumference --      Peak Flow --      Pain Score 09/25/18 0411 8     Pain Loc --      Pain Edu? --      Excl. in [place]? --    Constitutional: Alert and oriented. Well appearing and in no acute distress. Eyes: Conjunctivae are normal. PERRL. EOMI. fluorescein stain reveals corneal abrasion. Cardiovascular: Normal rate, regular rhythm. Grossly normal heart sounds.  Good peripheral circulation. Respiratory: Normal respiratory effort.  No retractions. Lungs CTAB. Skin:  Skin is warm, dry and intact. No rash noted. Psychiatric: Mood and affect are normal. Speech and behavior are normal.  ____________________________________________   LABS (all labs ordered are listed, but only abnormal results are displayed)  Labs Reviewed - No data to display ____________________________________________  EKG   ____________________________________________  RADIOLOGY  ED MD interpretation:    Official radiology report(s): No results found.  ____________________________________________   PROCEDURES  Procedure(s) performed (including Critical Care):  Procedures   ____________________________________________   INITIAL IMPRESSION / ASSESSMENT AND PLAN / ED COURSE  As part of my medical decision making, I reviewed the following data within the electronic MEDICAL RECORD NUMBER         Shelly Thornton was evaluated in Emergency Department on 09/25/2018 for the symptoms described in the history of present illness. She was evaluated in the context of the global COVID-19 pandemic, which necessitated consideration that the patient might be at risk for infection with the SARS-CoV-2 virus that causes COVID-19. Institutional protocols and algorithms that pertain to the evaluation of patients at risk for COVID-19 are in a state of rapid change based on information released by regulatory bodies including the CDC and federal and state organizations. These policies and algorithms were followed during the  patient's care in the ED.  Patient presents with left eye pain and foreign body sensation.  Physical consistent with corneal abrasion.  Patient given discharge care instructions and a prescription for gentamicin.  Patient advised to follow ophthalmology if no improvement in 2 days.      ____________________________________________   FINAL CLINICAL IMPRESSION(S) / ED DIAGNOSES  Final diagnoses:  Corneal abrasion, left, initial encounter     ED Discharge Orders         Ordered    gentamicin (GARAMYCIN) 0.3 % ophthalmic solution  Every 4 hours     09/25/18 0734           Note:  This document was prepared using Dragon voice recognition software and may include unintentional dictation errors.    Sable Feil, PA-C 09/25/18 [id number]    Duffy Bruce, MD 09/27/18 [phone]

## 2018-09-25 NOTE — ED Notes (Signed)
Pt c/o left eye pain and swelling that began yesterday morning. Pt is unsure is she has scraped or injured her eye in any way.

## 2018-09-25 NOTE — ED Triage Notes (Signed)
Patient states that when she woke up Friday morning she was having irritation in her left eye. Patient states that the pain became worse throughout the day.

## 2019-06-14 IMAGING — MG MM DIGITAL SCREENING IMPLANTS W/ CAD
8 of 10 series · 8 of 10 positions shown · non-contrast
Comparison: None available.

ADDENDUM:
2: Benign.
CLINICAL DATA: Screening.

EXAM:
DIGITAL SCREENING BILATERAL MAMMOGRAM WITH IMPLANTS AND CAD
The patient has retroglandular implants. Standard and implant
displaced views were performed.

[L CC (1 of 2)]
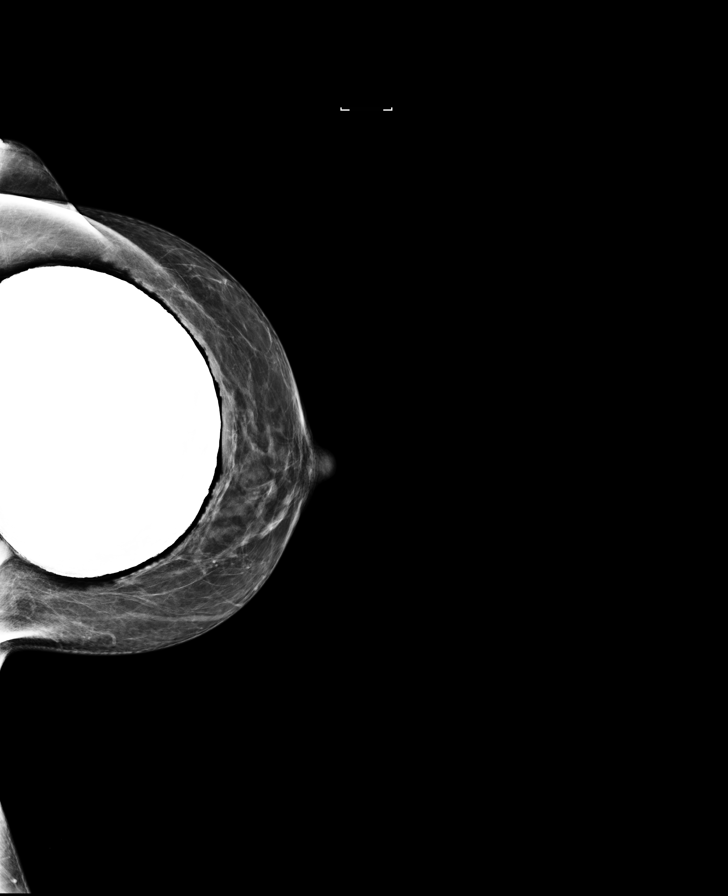

[L MLO (1 of 2)]
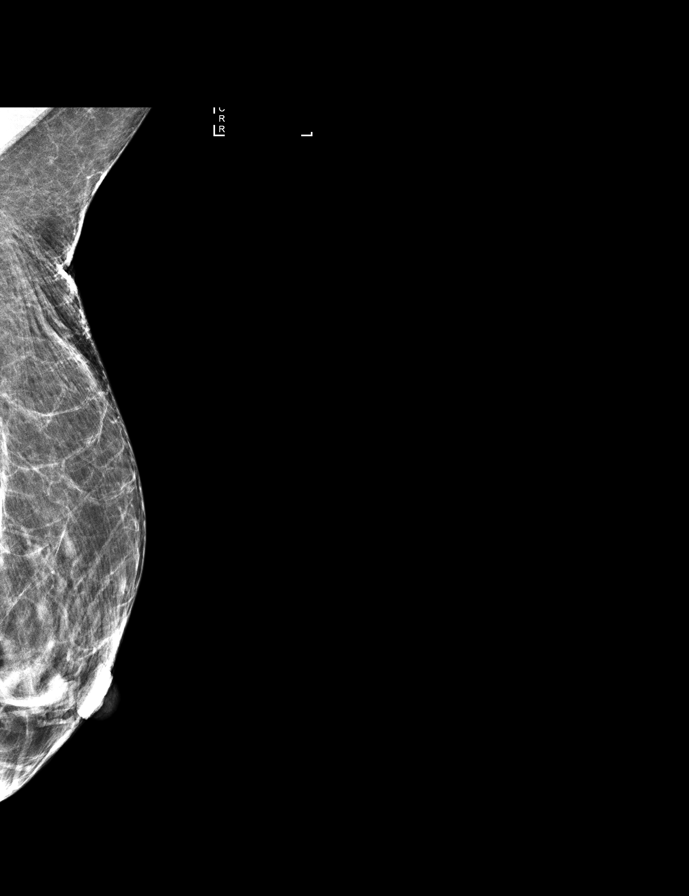

[R MLO (1 of 2)]
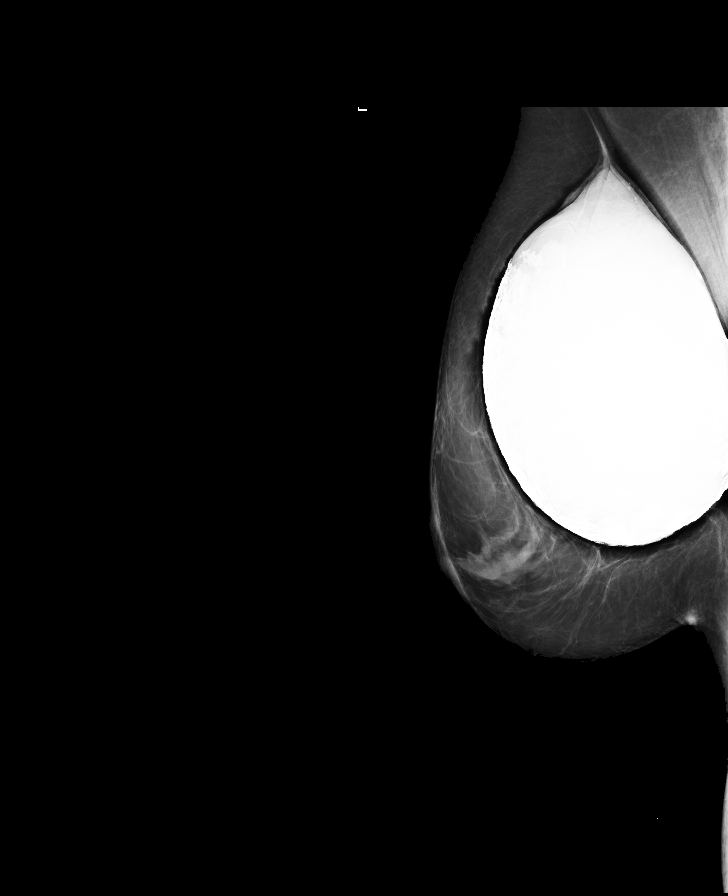

[R CC (1 of 2)]
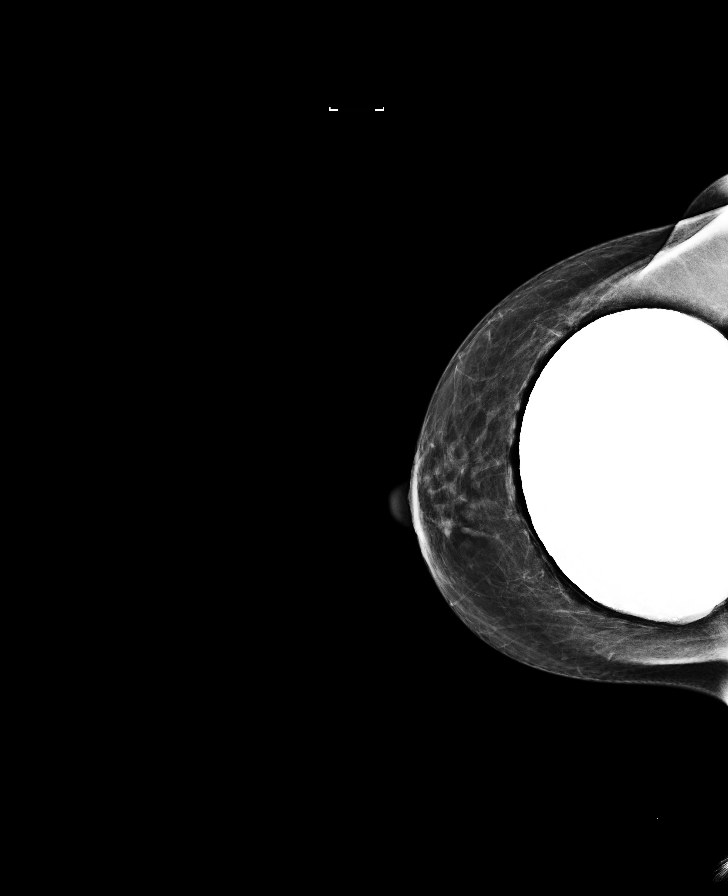

[R MLO (2 of 2)]
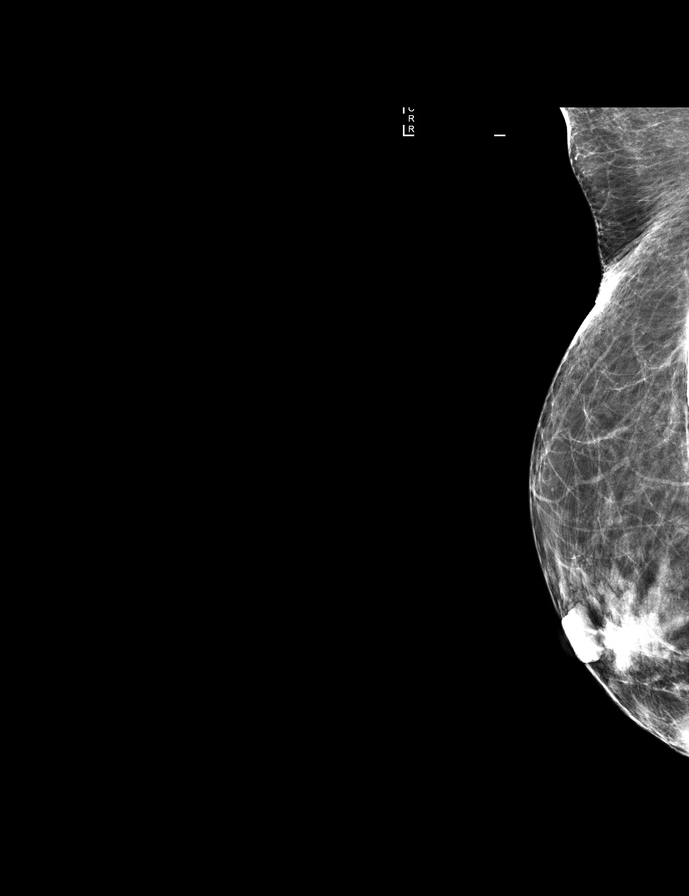

[L MLO (2 of 2)]
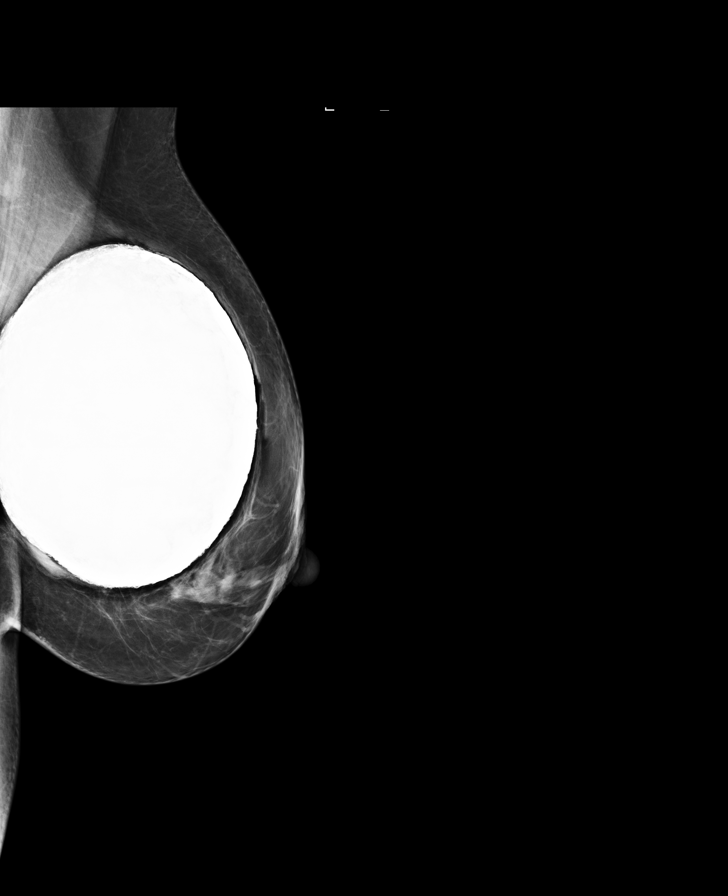

[R CC (2 of 2)]
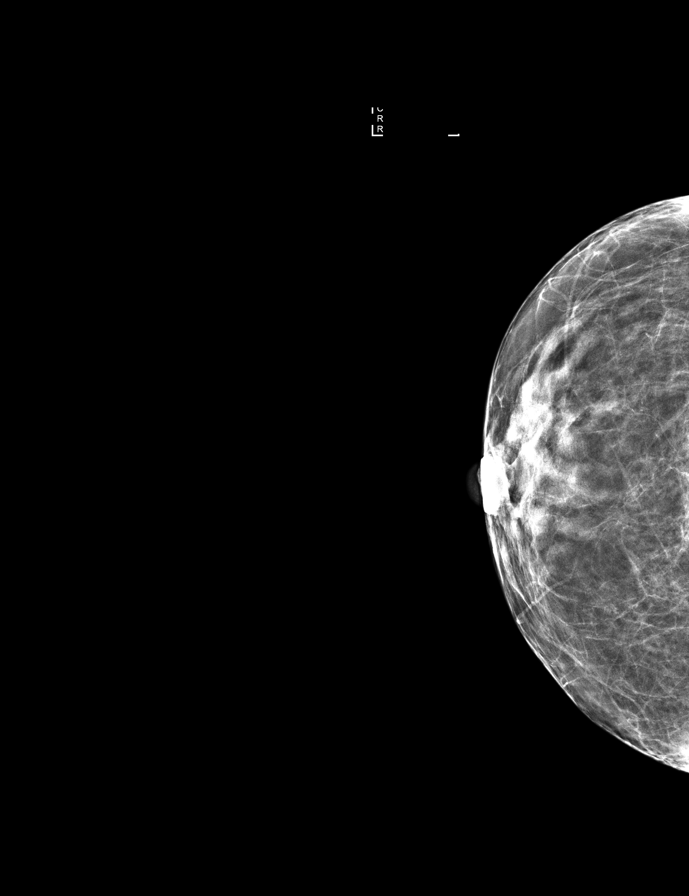

[L CC (2 of 2)]
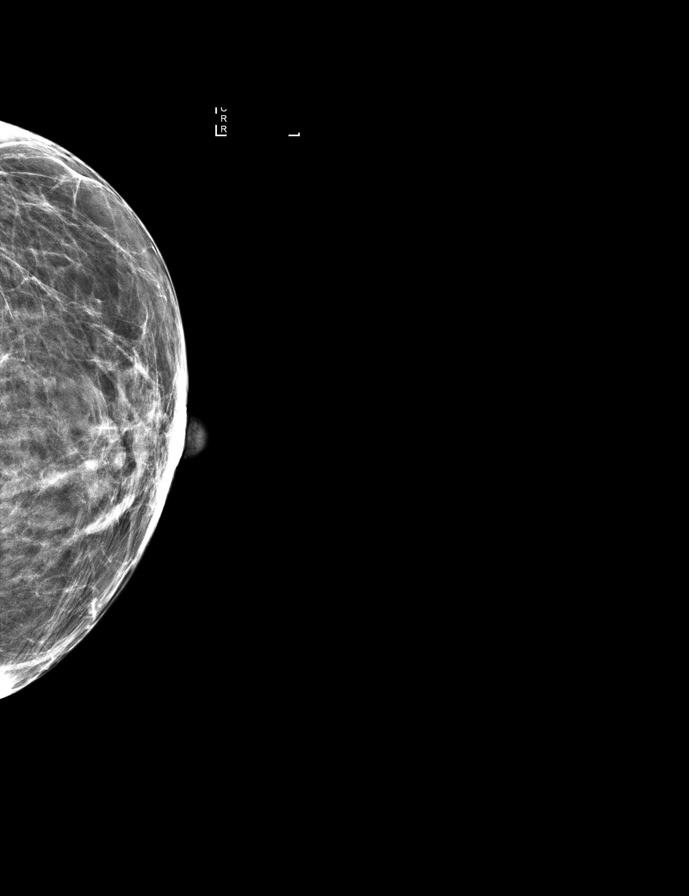

[8 of 10 positions shown; findings below may reference images not displayed]

ACR Breast Density Category c: The breast tissue is heterogeneously
dense, which may obscure small masses.
FINDINGS: There are no findings suspicious for malignancy. There are bilateral
subglandular silicone implants with evidence of extracapsular
rupture on the right. Questionable extracapsular silicone rupture in
the inferior left breast. Images were processed with CAD.
IMPRESSION: No mammographic evidence of malignancy. A result letter of this
screening mammogram will be mailed directly to the patient.

RECOMMENDATION:
Screening mammogram in one year. (Code:[id number]-648)

BI-RADS CATEGORY  No mammographic evidence of breast malignancy.

Bilateral subglandular silicone breast implants with extracapsular
rupture on the right and suspected extracapsular rupture on the
left.

Extracapsular silicone rupture of the right breast implant.

## 2020-02-08 ENCOUNTER — Telehealth: Payer: Self-pay

## 2020-02-08 NOTE — Telephone Encounter (Signed)
Copied from [place] [id number]. Topic: General - Other >> Feb 07, 2020  4:13 PM Rainey Pines A wrote: Patient would like a callback to confirm if pcp prescribes ivermectin. Please advise

## 2020-02-08 NOTE — Telephone Encounter (Signed)
Spoke to pt let her know that Dr. Army Melia does not prescribe ivermectin. Pt verbalized understanding.  KP

## 2020-04-25 ENCOUNTER — Encounter: Payer: Self-pay | Admitting: Internal Medicine

## 2020-04-25 ENCOUNTER — Other Ambulatory Visit: Payer: Self-pay

## 2020-04-25 ENCOUNTER — Ambulatory Visit (INDEPENDENT_AMBULATORY_CARE_PROVIDER_SITE_OTHER): Payer: Medicare PPO | Admitting: Internal Medicine

## 2020-04-25 VITALS — BP 112/78 | HR 64 | Temp 97.9°F | Ht 64.0 in | Wt 134.0 lb

## 2020-04-25 DIAGNOSIS — H6123 Impacted cerumen, bilateral: Secondary | ICD-10-CM | POA: Diagnosis not present

## 2020-04-25 DIAGNOSIS — H6690 Otitis media, unspecified, unspecified ear: Secondary | ICD-10-CM

## 2020-04-25 MED ORDER — AZITHROMYCIN 200 MG/5ML PO SUSR: 10.0000 mg/kg | Freq: Every day | ORAL | 0 refills | Status: AC

## 2020-04-25 NOTE — Progress Notes (Signed)
Date:  04/25/2020   Name:  Shelly Thornton   DOB:  [birth year]   MRN:  [id number]   Chief Complaint: Ear Pain (X 3 weeks, right ear, Face feels warm but no temp, no other symptoms)  Otalgia  There is pain in the right ear. The current episode started 1 to 4 weeks ago. The problem occurs constantly. The problem has been waxing and waning. There has been no fever. The pain is mild (mostly fullness on the right). Pertinent negatives include no abdominal pain, coughing, ear discharge, neck pain, rhinorrhea or sore throat. Treatments tried: antihistamines.    Lab Results  Component Value Date   CREATININE 0.84 02/11/2016   BUN 18 02/11/2016   NA 140 02/11/2016   K 4.3 02/11/2016   CL 97 02/11/2016   CO2 28 02/11/2016   Lab Results  Component Value Date   CHOL 259 (H) 02/11/2016   HDL 105 02/11/2016   LDLCALC 133 (H) 02/11/2016   TRIG 103 02/11/2016   CHOLHDL 2.5 02/11/2016   Lab Results  Component Value Date   TSH 3.490 02/11/2016   No results found for: HGBA1C Lab Results  Component Value Date   WBC 7.4 02/11/2016   HGB 13.6 02/11/2016   HCT 40.0 02/11/2016   MCV 92 02/11/2016   PLT 267 02/11/2016   Lab Results  Component Value Date   ALT 19 02/11/2016   AST 20 02/11/2016   ALKPHOS 63 02/11/2016   BILITOT 0.6 02/11/2016     Review of Systems  Constitutional: Negative for chills, fatigue and fever.  HENT: Positive for congestion, ear pain and tinnitus. Negative for ear discharge, rhinorrhea, sinus pressure, sinus pain, sore throat and trouble swallowing.   Respiratory: Negative for cough and shortness of breath.   Cardiovascular: Negative for chest pain.  Gastrointestinal: Negative for abdominal pain.  Musculoskeletal: Negative for neck pain.  Allergic/Immunologic: Positive for environmental allergies.    Patient Active Problem List   Diagnosis Date Noted  . Adenomatous colon polyp 03/10/2016  . HPV (human papilloma virus) infection 02/14/2016  . HPTH  (hyperparathyroidism) ([place]) 09/08/2014    Allergies  Allergen Reactions  . Penicillins Rash    Past Surgical History:  Procedure Laterality Date  . AUGMENTATION MAMMAPLASTY    . BREAST ENHANCEMENT SURGERY  1985  . COLONOSCOPY  ~2005   @ [facility] - incomplete  . COLONOSCOPY WITH PROPOFOL N/A 03/06/2016   Procedure: COLONOSCOPY WITH biopsy;  Surgeon: Lucilla Lame, MD;  Location: [place];  Service: Endoscopy;  Laterality: N/A;  . parathyroid surgery  2011  . POLYPECTOMY  03/06/2016   Procedure: POLYPECTOMY INTESTINAL;  Surgeon: Lucilla Lame, MD;  Location: [place];  Service: Endoscopy;;  Transverse colon polyp  . TONSILLECTOMY      Social History   Tobacco Use  . Smoking status: Former Smoker    Packs/day: 1.00    Years: 10.00    Pack years: 10.00    Types: Cigarettes  . Smokeless tobacco: Never Used  Vaping Use  . Vaping Use: Never used  Substance Use Topics  . Alcohol use: Yes    Alcohol/week: 0.0 standard drinks    Comment: socially  . Drug use: No     Medication list has been reviewed and updated.  Current Meds  Medication Sig  . albuterol (PROVENTIL HFA;VENTOLIN HFA) 108 (90 Base) MCG/ACT inhaler Inhale 2 puffs into the lungs every 6 (six) hours as needed for wheezing or shortness of breath.  . Aspirin-Caffeine (  BC FAST PAIN RELIEF PO) Take by mouth.    PHQ 2/9 Scores 04/25/2020 06/25/2017 02/11/2016  PHQ - 2 Score 0 0 0  PHQ- 9 Score 0 0 -    GAD 7 : Generalized Anxiety Score 04/25/2020  Nervous, Anxious, on Edge 0  Control/stop worrying 0  Worry too much - different things 0  Trouble relaxing 0  Restless 0  Easily annoyed or irritable 0  Afraid - awful might happen 0  Total GAD 7 Score 0    BP Readings from Last 3 Encounters:  04/25/20 112/78  09/25/18 128/69  08/29/17 119/62    Physical Exam Vitals and nursing note reviewed.  Constitutional:      General: She is not in acute distress.    Appearance: She is well-developed.   HENT:     Head: Normocephalic and atraumatic.     Right Ear: Hearing, ear canal and external ear normal. No decreased hearing noted. No middle ear effusion. There is impacted cerumen. Tympanic membrane is erythematous and retracted.     Left Ear: Hearing, ear canal and external ear normal. No decreased hearing noted.  No middle ear effusion. There is impacted cerumen. Tympanic membrane is erythematous and retracted.     Ears:     Comments: Cerumen filled both ear canals.  A large quantity of cerumen was  removed from both ears - initially with curette then with irrigation then again the curette was needed to removed the final cerumen.   Pt tolerated well with only transient minor dizziness.  Pulmonary:     Effort: Pulmonary effort is normal. No respiratory distress.  Skin:    General: Skin is warm and dry.     Findings: No rash.  Neurological:     Mental Status: She is alert and oriented to person, place, and time.  Psychiatric:        Mood and Affect: Mood normal.        Behavior: Behavior normal.     Wt Readings from Last 3 Encounters:  04/25/20 134 lb (60.8 kg)  09/25/18 145 lb (65.8 kg)  08/29/17 138 lb (62.6 kg)    BP 112/78   Pulse 64   Temp 97.9 F (36.6 C) (Oral)   Ht 5\' 4"  (1.626 m)   Wt 134 lb (60.8 kg)   SpO2 99%   BMI 23.00 kg/m   Assessment and Plan: 1. Acute otitis media, unspecified otitis media type Continue claritin and/or sudafed if helpful for sx relief - azithromycin (ZITHROMAX) 200 MG/5ML suspension; Take 15.2 mLs (608 mg total) by mouth daily for 5 days.  Dispense: 22.5 mL; Refill: 0  2. Impacted cerumen of both ears Use H2O2 - 1 drop in each ear bid for several days   Partially dictated using Editor, commissioning. Any errors are unintentional.  Halina Maidens, MD [facility]  04/25/2020

## 2020-05-29 ENCOUNTER — Encounter: Payer: Self-pay | Admitting: Internal Medicine

## 2020-05-29 ENCOUNTER — Other Ambulatory Visit: Payer: Self-pay

## 2020-05-29 ENCOUNTER — Ambulatory Visit: Payer: Medicare PPO | Admitting: Internal Medicine

## 2020-05-29 VITALS — BP 98/62 | HR 68 | Temp 98.2°F | Ht 64.0 in | Wt 134.0 lb

## 2020-05-29 DIAGNOSIS — R197 Diarrhea, unspecified: Secondary | ICD-10-CM

## 2020-05-29 NOTE — Progress Notes (Signed)
Date:  05/29/2020   Name:  Shelly Thornton   DOB:  May 16, 1955   MRN:  [id number]   Chief Complaint: Diarrhea (X 1 month. No solid stools since March. On and off abdominal tenderness. Diarrhea mostly in the mornings. )  Diarrhea  This is a new (has always tended to have loose stools) problem. The current episode started 1 to 4 weeks ago. The problem occurs 2 to 4 times per day. The problem has been unchanged. The stool consistency is described as watery. The patient states that diarrhea awakens her from sleep. Pertinent negatives include no abdominal pain, chills, fever, headaches, increased  flatus or vomiting. Nothing aggravates the symptoms. Risk factors: she is under significant home and financial stress at this time. She has tried bismuth subsalicylate (has already been on mild probiotics) for the symptoms. The treatment provided mild relief.    Lab Results  Component Value Date   CREATININE 0.84 02/11/2016   BUN 18 02/11/2016   NA 140 02/11/2016   K 4.3 02/11/2016   CL 97 02/11/2016   CO2 28 02/11/2016   Lab Results  Component Value Date   CHOL 259 (H) 02/11/2016   HDL 105 02/11/2016   LDLCALC 133 (H) 02/11/2016   TRIG 103 02/11/2016   CHOLHDL 2.5 02/11/2016   Lab Results  Component Value Date   TSH 3.490 02/11/2016   No results found for: HGBA1C Lab Results  Component Value Date   WBC 7.4 02/11/2016   HGB 13.6 02/11/2016   HCT 40.0 02/11/2016   MCV 92 02/11/2016   PLT 267 02/11/2016   Lab Results  Component Value Date   ALT 19 02/11/2016   AST 20 02/11/2016   ALKPHOS 63 02/11/2016   BILITOT 0.6 02/11/2016     Review of Systems  Constitutional: Negative for chills, diaphoresis, fatigue, fever and unexpected weight change.  Respiratory: Negative for chest tightness and shortness of breath.   Cardiovascular: Negative for chest pain and palpitations.  Gastrointestinal: Positive for diarrhea. Negative for abdominal pain, blood in stool, flatus, nausea, rectal  pain and vomiting.  Neurological: Negative for dizziness and headaches.  Psychiatric/Behavioral: Negative for dysphoric mood and sleep disturbance. The patient is not nervous/anxious.     Patient Active Problem List   Diagnosis Date Noted  . Adenomatous colon polyp 03/10/2016  . HPV (human papilloma virus) infection 02/14/2016  . Carpal tunnel syndrome 02/07/2016  . HPTH (hyperparathyroidism) ([place]) 09/08/2014    Allergies  Allergen Reactions  . Penicillins Rash    Past Surgical History:  Procedure Laterality Date  . AUGMENTATION MAMMAPLASTY    . BREAST ENHANCEMENT SURGERY  1985  . COLONOSCOPY  ~2005   @ [facility] - incomplete  . COLONOSCOPY WITH PROPOFOL N/A 03/06/2016   Procedure: COLONOSCOPY WITH biopsy;  Surgeon: Lucilla Lame, MD;  Location: [place];  Service: Endoscopy;  Laterality: N/A;  . parathyroid surgery  2011  . POLYPECTOMY  03/06/2016   Procedure: POLYPECTOMY INTESTINAL;  Surgeon: Lucilla Lame, MD;  Location: [place];  Service: Endoscopy;;  Transverse colon polyp  . TONSILLECTOMY      Social History   Tobacco Use  . Smoking status: Former Smoker    Packs/day: 1.00    Years: 10.00    Pack years: 10.00    Types: Cigarettes  . Smokeless tobacco: Never Used  Vaping Use  . Vaping Use: Never used  Substance Use Topics  . Alcohol use: Yes    Alcohol/week: 0.0 standard drinks  Comment: socially  . Drug use: No     Medication list has been reviewed and updated.  Current Meds  Medication Sig  . Aspirin-Caffeine (BC FAST PAIN RELIEF PO) Take by mouth.    PHQ 2/9 Scores 05/29/2020 04/25/2020 06/25/2017 02/11/2016  PHQ - 2 Score 0 0 0 0  PHQ- 9 Score 0 0 0 -    GAD 7 : Generalized Anxiety Score 05/29/2020 04/25/2020  Nervous, Anxious, on Edge 0 0  Control/stop worrying 0 0  Worry too much - different things 0 0  Trouble relaxing 0 0  Restless 0 0  Easily annoyed or irritable 0 0  Afraid - awful might happen 0 0  Total GAD 7 Score 0 0     BP Readings from Last 3 Encounters:  05/29/20 98/62  04/25/20 112/78  09/25/18 128/69    Physical Exam Vitals and nursing note reviewed.  Constitutional:      General: She is not in acute distress.    Appearance: Normal appearance. She is well-developed.  HENT:     Head: Normocephalic and atraumatic.  Cardiovascular:     Rate and Rhythm: Normal rate and regular rhythm.     Pulses: Normal pulses.     Heart sounds: No murmur heard.   Pulmonary:     Effort: Pulmonary effort is normal. No respiratory distress.     Breath sounds: No wheezing or rhonchi.  Musculoskeletal:     Cervical back: Normal range of motion.  Skin:    General: Skin is warm and dry.     Capillary Refill: Capillary refill takes less than 2 seconds.     Findings: No rash.  Neurological:     General: No focal deficit present.     Mental Status: She is alert and oriented to person, place, and time.  Psychiatric:        Mood and Affect: Mood normal.        Behavior: Behavior normal.     Wt Readings from Last 3 Encounters:  05/29/20 134 lb (60.8 kg)  04/25/20 134 lb (60.8 kg)  09/25/18 145 lb (65.8 kg)    BP 98/62   Pulse 68   Temp 98.2 F (36.8 C) (Oral)   Ht 5\' 4"  (1.626 m)   Wt 134 lb (60.8 kg)   SpO2 97%   BMI 23.00 kg/m   Assessment and Plan: 1. Diarrhea, unspecified type Most likely IBS-D with recent stress Could also be infectious but less likely due to length of time of worse sx. Begin Florastor probiotics - GI Profile, Stool, PCR   Partially dictated using Editor, commissioning. Any errors are unintentional.  Halina Maidens, MD [facility]  05/29/2020

## 2020-05-29 NOTE — Patient Instructions (Addendum)
Start a high potency probiotics.  Florastor - at the pharmacy

## 2020-06-20 ENCOUNTER — Telehealth: Payer: Self-pay

## 2020-06-20 NOTE — Telephone Encounter (Signed)
She can discuss it with GI next month.

## 2020-06-20 NOTE — Telephone Encounter (Signed)
Patient has an appointment with GI 07/03/2020.  Please advise.

## 2020-06-20 NOTE — Telephone Encounter (Unsigned)
Copied from [place] [phone]. Topic: General - Call Back - No Documentation >> Jun 20, 2020 12:14 PM Erick Blinks wrote: Reason for CRM:   Pt is ready to have a stool test, she declined during last appt but is ready now  Best contact: [phone]

## 2020-06-21 NOTE — Telephone Encounter (Signed)
Called pt let her know that the text was ordered 05/29/2020. Pt verbalized understanding.  KP

## 2020-06-26 ENCOUNTER — Other Ambulatory Visit: Payer: Self-pay | Admitting: Internal Medicine

## 2020-06-26 DIAGNOSIS — A0472 Enterocolitis due to Clostridium difficile, not specified as recurrent: Secondary | ICD-10-CM

## 2020-06-26 LAB — GI PROFILE, STOOL, PCR

## 2020-06-26 MED ORDER — VANCOMYCIN HCL 125 MG PO CAPS: 125.0000 mg | ORAL_CAPSULE | Freq: Four times a day (QID) | ORAL | 0 refills | Status: AC

## 2020-07-03 ENCOUNTER — Other Ambulatory Visit: Payer: Self-pay

## 2020-07-03 ENCOUNTER — Encounter: Payer: Self-pay | Admitting: Gastroenterology

## 2020-07-03 ENCOUNTER — Ambulatory Visit (INDEPENDENT_AMBULATORY_CARE_PROVIDER_SITE_OTHER): Payer: Medicare PPO | Admitting: Gastroenterology

## 2020-07-03 VITALS — BP 119/63 | HR 68 | Ht 64.0 in | Wt 133.8 lb

## 2020-07-03 DIAGNOSIS — A09 Infectious gastroenteritis and colitis, unspecified: Secondary | ICD-10-CM

## 2020-07-03 NOTE — Progress Notes (Signed)
Gastroenterology Consultation  Referring Provider:     Glean Hess, MD Primary Care Physician:  Shelly Hess, MD Primary Gastroenterologist:  Dr. Allen Norris     Reason for Consultation:     Diarrhea        HPI:   Shelly Thornton is a 65 y.o. y/o female referred for consultation & management of Diarrhea by Shelly Thornton, Shelly Sans, MD.  This patient comes in with a report of having diarrhea since March.  The patient states she is been under a lot of stress with her sister having a carcinoid tumor this metastatic and going to hospice.  The patient also reports that her husband have started new business.  The patient went to her primary care provider who had send stool studies off because of her diarrhea and the patient was found to have C. Difficile.  The patient is now on day 7 of vancomycin and states that her diarrhea is still present.  She denies any foul-smelling stools, abdominal pain, rectal bleeding or nausea and vomiting.  She is concerned that her sisters diarrhea was the presenting symptom for her carcinoid.  Sister had carcinoid on the outside of the colon that was not seen during a colonoscopy but was later found metastatic to the liver.  The patient also reports that she had her parathyroid taken out in the past.  She is concerned that she may have a familial syndrome since her sister has a neuroendocrine tumor.  Past Medical History:  Diagnosis Date  . Bursitis    left shoulder and right wrist  . Headache    occasional due muscle in shoulders possible migraine    Past Surgical History:  Procedure Laterality Date  . AUGMENTATION MAMMAPLASTY    . BREAST ENHANCEMENT SURGERY  1985  . COLONOSCOPY  ~2005   @ [facility] - incomplete  . COLONOSCOPY WITH PROPOFOL N/A 03/06/2016   Procedure: COLONOSCOPY WITH biopsy;  Surgeon: Shelly Lame, MD;  Location: [facility];  Service: Endoscopy;  Laterality: N/A;  . parathyroid surgery  2011  . POLYPECTOMY  03/06/2016   Procedure:  POLYPECTOMY INTESTINAL;  Surgeon: Shelly Lame, MD;  Location: [facility];  Service: Endoscopy;;  Transverse colon polyp  . TONSILLECTOMY      Prior to Admission medications   Medication Sig Start Date End Date Taking? Authorizing Provider  Aspirin-Caffeine (BC FAST PAIN RELIEF PO) Take by mouth.   Yes [provider]  vancomycin (VANCOCIN) 125 MG capsule Take 1 capsule (125 mg total) by mouth 4 (four) times daily for 10 days. 06/26/20 07/06/20 Yes Shelly Hess, MD    Family History  Problem Relation Age of Onset  . Thyroid cancer Mother   . CAD Father 75  . Cancer Sister        carcinoid of intestine  . Breast cancer Neg Hx      Social History   Tobacco Use  . Smoking status: Former Smoker    Packs/day: 1.00    Years: 10.00    Pack years: 10.00    Types: Cigarettes  . Smokeless tobacco: Never Used  Vaping Use  . Vaping Use: Never used  Substance Use Topics  . Alcohol use: Yes    Alcohol/week: 0.0 standard drinks    Comment: socially  . Drug use: No    Allergies as of 07/03/2020 - Review Complete 07/03/2020  Allergen Reaction Noted  . Penicillins Rash 09/08/2014    Review of Systems:    All  systems reviewed and negative except where noted in HPI.   Physical Exam:  BP 119/63   Pulse 68   Ht 5\' 4"  (1.626 m)   Wt 133 lb 12.8 oz (60.7 kg)   BMI 22.97 kg/m  No LMP recorded. Patient is postmenopausal. General:   Alert,  Well-developed, well-nourished, pleasant and cooperative in NAD Head:  Normocephalic and atraumatic. Eyes:  Sclera clear, no icterus.   Conjunctiva pink. Ears:  Normal auditory acuity. Neck:  Supple; no masses or thyromegaly. Lungs:  Respirations even and unlabored.  Clear throughout to auscultation.   No wheezes, crackles, or rhonchi. No acute distress. Heart:  Regular rate and rhythm; no murmurs, clicks, rubs, or gallops. Abdomen:  Normal bowel sounds.  No bruits.  Soft, non-tender and non-distended without masses,  hepatosplenomegaly or hernias noted.  No guarding or rebound tenderness.  Negative Carnett sign.   Rectal:  Deferred.  Pulses:  Normal pulses noted. Extremities:  No clubbing or edema.  No cyanosis. Neurologic:  Alert and oriented x3;  grossly normal neurologically. Skin:  Intact without significant lesions or rashes.  No jaundice. Lymph Nodes:  No significant cervical adenopathy. Psych:  Alert and cooperative. Normal mood and affect.  Imaging Studies: No results found.  Assessment and Plan:   Shelly Thornton is a 65 y.o. y/o female who comes in today with a history of diarrhea and C. Difficile positive on her GI panel. The patient will continue the vancomycin for a total of 10 days and then has been told to contact me a week after she finishes the treatment to see if her C. Difficile is then negative and if her diarrhea has gone away.  She has been told that if her C. Difficile does not resolve she may need to be started on deficit and if her diarrhea does not resolve and her C. Difficile is negative then other issues such as microscopic colitis versus irritable bowel syndrome should be considered.  She has been told that if she is concerned about a MEN 1 hereditary syndrome that her sister should be tested and if negative then none of the other family members need to be tested.  The patient has been explained the plan and agrees with it.    Shelly Lame, MD. Marval Regal    Note: This dictation was prepared with Dragon dictation along with smaller phrase technology. Any transcriptional errors that result from this process are unintentional.

## 2020-07-10 ENCOUNTER — Other Ambulatory Visit: Payer: Self-pay

## 2020-07-10 DIAGNOSIS — A09 Infectious gastroenteritis and colitis, unspecified: Secondary | ICD-10-CM

## 2020-07-10 DIAGNOSIS — Z1379 Encounter for other screening for genetic and chromosomal anomalies: Secondary | ICD-10-CM

## 2020-07-11 ENCOUNTER — Other Ambulatory Visit: Payer: Self-pay

## 2020-07-11 DIAGNOSIS — A09 Infectious gastroenteritis and colitis, unspecified: Secondary | ICD-10-CM

## 2020-07-13 DIAGNOSIS — A09 Infectious gastroenteritis and colitis, unspecified: Secondary | ICD-10-CM | POA: Diagnosis not present

## 2020-07-15 LAB — C DIFFICILE TOXINS A+B W/RFLX: C difficile Toxins A+B, EIA: POSITIVE — AB

## 2020-07-16 ENCOUNTER — Telehealth: Payer: Self-pay

## 2020-07-16 DIAGNOSIS — A09 Infectious gastroenteritis and colitis, unspecified: Secondary | ICD-10-CM

## 2020-07-16 NOTE — Telephone Encounter (Signed)
-----   Message from Lucilla Lame, MD sent at 07/16/2020  2:03 PM EDT ----- Patient needs a repeat c.Diff test in 10 days.

## 2020-07-16 NOTE — Telephone Encounter (Signed)
Order the stool test for 10 days and informed patient

## 2020-07-19 ENCOUNTER — Encounter: Payer: Self-pay | Admitting: Licensed Clinical Social Worker

## 2020-07-19 ENCOUNTER — Inpatient Hospital Stay: Payer: Medicare PPO | Attending: Oncology | Admitting: Licensed Clinical Social Worker

## 2020-07-19 ENCOUNTER — Inpatient Hospital Stay: Payer: Medicare PPO

## 2020-07-19 DIAGNOSIS — Z808 Family history of malignant neoplasm of other organs or systems: Secondary | ICD-10-CM

## 2020-07-19 DIAGNOSIS — Z809 Family history of malignant neoplasm, unspecified: Secondary | ICD-10-CM

## 2020-07-19 NOTE — Progress Notes (Signed)
REFERRING PROVIDER: Lucilla Lame, MD [address] [place] ,  [place] [address]  PRIMARY PROVIDER:  Glean Hess, MD  PRIMARY REASON FOR VISIT:  1. Family history of carcinoid tumor   2. Family history of thyroid cancer      HISTORY OF PRESENT ILLNESS:   Ms. Hollern, a 65 y.o. female, was seen for a [place] cancer genetics consultation at the request of Dr. Allen Norris due to a family history of carcinoid tumor.  Ms. Tyrell presents to clinic today to discuss the possibility of a hereditary predisposition to cancer, genetic testing, and to further clarify her future cancer risks, as well as potential cancer risks for family members.   Ms. Baumgarten is a 65 y.o. female with no personal history of cancer.  She had a parathyroid tumor removed at 52 that was benign, no further information.   CANCER HISTORY:  Oncology History   No history exists.     RISK FACTORS:  Menarche was at age 33.  First live birth at age 36.  Ovaries intact: yes.  Hysterectomy: no.  Menopausal status: postmenopausal.  HRT use: 5 years. Colonoscopy: yes;  1 polyp . Mammogram within the last year: no. Number of breast biopsies: 0. Up to date with pelvic exams: yes.  Past Medical History:  Diagnosis Date   Bursitis    left shoulder and right wrist   Family history of carcinoid tumor    Family history of thyroid cancer    Headache    occasional due muscle in shoulders possible migraine    Past Surgical History:  Procedure Laterality Date   AUGMENTATION MAMMAPLASTY     BREAST ENHANCEMENT SURGERY  1985   COLONOSCOPY  ~2005   @ [facility] - incomplete   COLONOSCOPY WITH PROPOFOL N/A 03/06/2016   Procedure: COLONOSCOPY WITH biopsy;  Surgeon: Lucilla Lame, MD;  Location: [facility];  Service: Endoscopy;  Laterality: N/A;   parathyroid surgery  2011   POLYPECTOMY  03/06/2016   Procedure: POLYPECTOMY INTESTINAL;  Surgeon: Lucilla Lame, MD;  Location: [facility];  Service: Endoscopy;;  Transverse  colon polyp   TONSILLECTOMY      Social History   Socioeconomic History   Marital status: Married    Spouse name: Not on file   Number of children: Not on file   Years of education: Not on file   Highest education level: Not on file  Occupational History   Not on file  Tobacco Use   Smoking status: Former    Packs/day: 1.00    Years: 10.00    Pack years: 10.00    Types: Cigarettes   Smokeless tobacco: Never  Vaping Use   Vaping Use: Never used  Substance and Sexual Activity   Alcohol use: Yes    Alcohol/week: 0.0 standard drinks    Comment: socially   Drug use: No   Sexual activity: Not on file  Other Topics Concern   Not on file  Social History Narrative   Not on file   Social Determinants of Health   Financial Resource Strain: Not on file  Food Insecurity: Not on file  Transportation Needs: Not on file  Physical Activity: Not on file  Stress: Not on file  Social Connections: Not on file     FAMILY HISTORY:  We obtained a detailed, 4-generation family history.  Significant diagnoses are listed below: Family History  Problem Relation Age of Onset   Thyroid cancer Mother 28   CAD Father 42  Cancer Sister 70       carcinoid of intestine   Breast cancer Neg Hx    Ms. Frohlich has 2 daughters and 1 son, no cancers. She has 1 brother, 1 sister. Her sister was diagnosed with a carcinoid tumor at 4 that is now metastatic.  Ms. Algeo mother had thyroid cancer at 73, unknown type, and is living at 37. Patient had 8 maternal aunts/uncles, none had cancer that she is aware of, limited information. Maternal grandmother died of a stroke at 100. Grandfather died young of alcoholism.  Ms. Fuentes father was raised in an orphanage and does not have information about her paternal side. Her father died at 71 of heart disease.  Ms. Andrew is unaware of previous family history of genetic testing for hereditary cancer risks. Patient's maternal ancestors are of  English/N.European descent, and paternal ancestors are of unknown descent. There is no reported Ashkenazi Jewish ancestry. There is no known consanguinity.    GENETIC COUNSELING ASSESSMENT: Ms. Goodgame is a 65 y.o. female with a family history which is somewhat suggestive of a hereditary cancer syndrome and predisposition to cancer. We, therefore, discussed and recommended the following at today's visit.   DISCUSSION: We discussed that approximately 5-10% of cancer is hereditary. We discussed MEN1. This condition is associated with endocrine and nonendocrine tumors, including parathyroid tumors, pituitary tumors, carcinoid tumors and others. Individuals with this condition typically have two or more endocrine tumors.. We can also look at other genes associated with other types of cancer, including thyroid, breast, etc. Cancers and risks are gene specific.  We discussed that testing is beneficial for several reasons including  knowing about cancer risks, identifying potential screening and risk-reduction options that may be appropriate, and to understand if other family members could be at risk for cancer and allow them to undergo genetic testing.   We reviewed the characteristics, features and inheritance patterns of hereditary cancer syndromes. We also discussed genetic testing, including the appropriate family members to test, the process of testing, insurance coverage and turn-around-time for results. We discussed the implications of a negative, positive and/or variant of uncertain significant result. We recommended Ms. Luan Pulling pursue genetic testing for the [facility] CancerNext-Expanded+RNA gene panel.   The CancerNext-Expanded + RNAinsight gene panel offered by [employer] and includes sequencing and rearrangement analysis for the following 77 genes: IP, ALK, APC*, ATM*, AXIN2, BAP1, BARD1, BLM, BMPR1A, BRCA1*, BRCA2*, BRIP1*, CDC73, CDH1*,CDK4, CDKN1B, CDKN2A, CHEK2*, CTNNA1, DICER1, FANCC, FH, FLCN,  GALNT12, KIF1B, LZTR1, MAX, MEN1, MET, MLH1*, MSH2*, MSH3, MSH6*, MUTYH*, NBN, NF1*, NF2, NTHL1, PALB2*, PHOX2B, PMS2*, POT1, PRKAR1A, PTCH1, PTEN*, RAD51C*, RAD51D*,RB1, RECQL, RET, SDHA, SDHAF2, SDHB, SDHC, SDHD, SMAD4, SMARCA4, SMARCB1, SMARCE1, STK11, SUFU, TMEM127, TP53*,TSC1, TSC2, VHL and XRCC2 (sequencing and deletion/duplication); EGFR, EGLN1, HOXB13, KIT, MITF, PDGFRA, POLD1 and POLE (sequencing only); EPCAM and GREM1 (deletion/duplication only).  Based on Ms. Shadrick's family history of cancer, she meets medical criteria for genetic testing. Despite that she meets criteria, she may still have an out of pocket cost. We discussed that if her out of pocket cost for testing is over $100, the laboratory will call and confirm whether she wants to proceed with testing.  If the out of pocket cost of testing is less than $100 she will be billed by the genetic testing laboratory.   PLAN: After considering the risks, benefits, and limitations, Ms. Luan Pulling provided informed consent to pursue genetic testing and the blood sample was sent to [facility] for analysis of the CancerNext-Expanded+RNA  panel. Results should be available within approximately 2-3 weeks' time, at which point they will be disclosed by telephone to Ms. Luan Pulling, as will any additional recommendations warranted by these results. Ms. Tidmore will receive a summary of her genetic counseling visit and a copy of her results once available. This information will also be available in Epic.   Ms. Dano questions were answered to her satisfaction today. Our contact information was provided should additional questions or concerns arise. Thank you for the referral and allowing [place] to share in the care of your patient.   Faith Rogue, MS, [facility] Genetic Counselor [email or web address] Phone: [phone]  The patient was seen for a total of 30 minutes in face-to-face genetic counseling.  Patient was seen alone. Dr. Grayland Ormond was  available for discussion regarding this case.   _______________________________________________________________________ For Office Staff:  Number of people involved in session: 1 Was an Intern/ student involved with case: no

## 2020-07-25 ENCOUNTER — Ambulatory Visit: Payer: BC Managed Care – PPO | Admitting: Gastroenterology

## 2020-07-25 DIAGNOSIS — A09 Infectious gastroenteritis and colitis, unspecified: Secondary | ICD-10-CM | POA: Diagnosis not present

## 2020-07-27 ENCOUNTER — Telehealth: Payer: Self-pay

## 2020-07-27 NOTE — Telephone Encounter (Signed)
Pt notified of lab results

## 2020-07-27 NOTE — Telephone Encounter (Signed)
-----   Message from Lucilla Lame, MD sent at 07/27/2020  8:30 AM EDT ----- Let the patient know the C. Diff is now negative.

## 2020-07-29 LAB — C DIFFICILE, CYTOTOXIN B

## 2020-07-29 LAB — C DIFFICILE TOXINS A+B W/RFLX: C difficile Toxins A+B, EIA: NEGATIVE

## 2020-08-03 ENCOUNTER — Encounter: Payer: Self-pay | Admitting: Licensed Clinical Social Worker

## 2020-08-03 ENCOUNTER — Ambulatory Visit: Payer: Self-pay | Admitting: Licensed Clinical Social Worker

## 2020-08-03 ENCOUNTER — Telehealth: Payer: Self-pay | Admitting: Licensed Clinical Social Worker

## 2020-08-03 DIAGNOSIS — Z1379 Encounter for other screening for genetic and chromosomal anomalies: Secondary | ICD-10-CM | POA: Insufficient documentation

## 2020-08-03 DIAGNOSIS — Z808 Family history of malignant neoplasm of other organs or systems: Secondary | ICD-10-CM

## 2020-08-03 DIAGNOSIS — Z809 Family history of malignant neoplasm, unspecified: Secondary | ICD-10-CM

## 2020-08-03 NOTE — Progress Notes (Signed)
HPI:  Ms. Yaun was previously seen in the [facility] clinic due to a family history of cancer and concerns regarding a hereditary predisposition to cancer. Please refer to our prior cancer genetics clinic note for more information regarding our discussion, assessment and recommendations, at the time. Ms. Cottier recent genetic test results were disclosed to her, as were recommendations warranted by these results. These results and recommendations are discussed in more detail below.  CANCER HISTORY:  Oncology History   No history exists.    FAMILY HISTORY:  We obtained a detailed, 4-generation family history.  Significant diagnoses are listed below: Family History  Problem Relation Age of Onset   Thyroid cancer Mother 18   CAD Father 54   Cancer Sister 89       carcinoid of intestine   Breast cancer Neg Hx     Ms. Joaquin has 2 daughters and 1 son, no cancers. She has 1 brother, 1 sister. Her sister was diagnosed with a carcinoid tumor at 43 that is now metastatic.   Ms. Berntsen mother had thyroid cancer at 28, unknown type, and is living at 69. Patient had 8 maternal aunts/uncles, none had cancer that she is aware of, limited information. Maternal grandmother died of a stroke at 54. Grandfather died young of alcoholism.   Ms. Mccarey father was raised in an orphanage and does not have information about her paternal side. Her father died at 3 of heart disease.   Ms. Tat is unaware of previous family history of genetic testing for hereditary cancer risks. Patient's maternal ancestors are of English/N.European descent, and paternal ancestors are of unknown descent. There is no reported Ashkenazi Jewish ancestry. There is no known consanguinity.  GENETIC TEST RESULTS: Genetic testing reported out on 07/31/2020 through the [facility] CancerNext-Expanded+RNA cancer panel found no pathogenic mutations.   The CancerNext-Expanded + RNAinsight gene panel offered by [employer] [facility] and includes sequencing and rearrangement analysis for the following 77 genes: IP, ALK, APC*, ATM*, AXIN2, BAP1, BARD1, BLM, BMPR1A, BRCA1*, BRCA2*, BRIP1*, CDC73, CDH1*,CDK4, CDKN1B, CDKN2A, CHEK2*, CTNNA1, DICER1, FANCC, FH, FLCN, GALNT12, KIF1B, LZTR1, MAX, MEN1, MET, MLH1*, MSH2*, MSH3, MSH6*, MUTYH*, NBN, NF1*, NF2, NTHL1, PALB2*, PHOX2B, PMS2*, POT1, PRKAR1A, PTCH1, PTEN*, RAD51C*, RAD51D*,RB1, RECQL, RET, SDHA, SDHAF2, SDHB, SDHC, SDHD, SMAD4, SMARCA4, SMARCB1, SMARCE1, STK11, SUFU, TMEM127, TP53*,TSC1, TSC2, VHL and XRCC2 (sequencing and deletion/duplication); EGFR, EGLN1, HOXB13, KIT, MITF, PDGFRA, POLD1 and POLE (sequencing only); EPCAM and GREM1 (deletion/duplication only).  The test report has been scanned into EPIC and is located under the Molecular Pathology section of the Results Review tab.  A portion of the result report is included below for reference.     We discussed that because current genetic testing is not perfect, it is possible there may be a gene mutation in one of these genes that current testing cannot detect, but that chance is small.  There could be another gene that has not yet been discovered, or that we have not yet tested, that is responsible for the cancer diagnoses in the family. It is also possible there is a hereditary cause for the cancer in the family that Ms. Krahl did not inherit and therefore was not identified in her testing.  Therefore, it is important to remain in touch with cancer genetics in the future so that we can continue to offer Ms. Dimercurio the most up to date genetic testing.   Genetic testing did identify a variant of uncertain significance (VUS) in the ATM gene.  At this time, it is unknown if this variant is associated with increased cancer risk or if this is a normal finding, but most variants such as this get reclassified to being inconsequential. It should not be used to make medical management decisions. With time, we suspect the lab  will determine the significance of this variant, if any. If we do learn more about it we will try to contact Ms. Lein to discuss it further. However, it is important to stay in touch with [place] periodically and keep the address and phone number up to date.  ADDITIONAL GENETIC TESTING: We discussed with Ms. Halperin that her genetic testing was fairly extensive.  If there are genes identified to increase cancer risk that can be analyzed in the future, we would be happy to discuss and coordinate this testing at that time.    CANCER SCREENING RECOMMENDATIONS: Ms. Guizar test result is considered negative (normal).  This means that we have not identified a hereditary cause for her family history of cancer at this time.   While reassuring, this does not definitively rule out a hereditary predisposition to cancer. It is still possible that there could be genetic mutations that are undetectable by current technology. There could be genetic mutations in genes that have not been tested or identified to increase cancer risk.  Therefore, it is recommended she continue to follow the cancer management and screening guidelines provided by her primary healthcare provider.   An individual's cancer risk and medical management are not determined by genetic test results alone. Overall cancer risk assessment incorporates additional factors, including personal medical history, family history, and any available genetic information that may result in a personalized plan for cancer prevention and surveillance.  RECOMMENDATIONS FOR FAMILY MEMBERS:  Relatives in this family might be at some increased risk of developing cancer, over the general population risk, simply due to the family history of cancer.  We recommended female relatives in this family have a yearly mammogram beginning at age 10, or 35 years younger than the earliest onset of cancer, an annual clinical breast exam, and perform monthly breast self-exams. Female  relatives in this family should also have a gynecological exam as recommended by their primary provider.  All family members should be referred for colonoscopy starting at age 75.   FOLLOW-UP: Lastly, we discussed with Ms. Tritz that cancer genetics is a rapidly advancing field and it is possible that new genetic tests will be appropriate for her and/or her family members in the future. We encouraged her to remain in contact with cancer genetics on an annual basis so we can update her personal and family histories and let her know of advances in cancer genetics that may benefit this family.   Our contact number was provided. Ms. Skillman questions were answered to her satisfaction, and she knows she is welcome to call us at anytime with additional questions or concerns.   Faith Rogue, MS, [facility] Genetic Counselor [email or web address] Phone: [phone]

## 2020-08-03 NOTE — Telephone Encounter (Signed)
Revealed negative genetic testing.  Revealed that a VUS in ATM was identified. This normal result is reassuring.  It is unlikely that there is an increased risk of cancer due to a mutation in one of these genes.  However, genetic testing is not perfect, and cannot definitively rule out a hereditary cause.  It will be important for her to keep in contact with genetics to learn if any additional testing.

## 2020-08-29 ENCOUNTER — Ambulatory Visit (INDEPENDENT_AMBULATORY_CARE_PROVIDER_SITE_OTHER): Payer: Medicare PPO

## 2020-08-29 ENCOUNTER — Other Ambulatory Visit: Payer: Self-pay

## 2020-08-29 VITALS — BP 108/72 | HR 70 | Temp 98.2°F | Resp 16 | Ht 64.0 in | Wt 136.6 lb

## 2020-08-29 DIAGNOSIS — Z0000 Encounter for general adult medical examination without abnormal findings: Secondary | ICD-10-CM

## 2020-08-29 DIAGNOSIS — Z1231 Encounter for screening mammogram for malignant neoplasm of breast: Secondary | ICD-10-CM | POA: Diagnosis not present

## 2020-08-29 DIAGNOSIS — Z780 Asymptomatic menopausal state: Secondary | ICD-10-CM | POA: Diagnosis not present

## 2020-08-29 NOTE — Progress Notes (Signed)
Subjective:   Shelly Thornton is a 65 y.o. female who presents for an Initial Medicare Annual Wellness Visit.  Review of Systems     Cardiac Risk Factors include: advanced age (>48mn, >>60women)     Objective:    Today's Vitals   08/29/20 1532  BP: 108/72  Pulse: 70  Resp: 16  Temp: 98.2 F (36.8 C)  TempSrc: Oral  SpO2: 97%  Weight: 136 lb 9.6 oz (62 kg)  Height: '5\' 4"'$  (1.626 m)   Body mass index is 23.45 kg/m.  Advanced Directives 08/29/2020 09/25/2018 08/29/2017 03/06/2016 02/11/2016  Does Patient Have a Medical Advance Directive? No No No No No  Would patient like information on creating a medical advance directive? No - Patient declined - - No - Patient declined No - Patient declined    Current Medications (verified) Outpatient Encounter Medications as of 08/29/2020  Medication Sig   Aspirin-Caffeine (BC FAST PAIN RELIEF PO) Take by mouth.   Probiotic Product (PROBIOTIC-10 PO) Take by mouth.   No facility-administered encounter medications on file as of 08/29/2020.    Allergies (verified) Penicillins   History: Past Medical History:  Diagnosis Date   Bursitis    left shoulder and right wrist   Family history of carcinoid tumor    Family history of thyroid cancer    Headache    occasional due muscle in shoulders possible migraine   Past Surgical History:  Procedure Laterality Date   AUGMENTATION MAMMAPLASTY     BREAST ENHANCEMENT SURGERY  1985   COLONOSCOPY  ~2005   @ [facility] - incomplete   COLONOSCOPY WITH PROPOFOL N/A 03/06/2016   Procedure: COLONOSCOPY WITH biopsy;  Surgeon: DLucilla Lame MD;  [place] [facility]  Service: Endoscopy;  Laterality: N/A;   parathyroid surgery  2011   POLYPECTOMY  03/06/2016   Procedure: POLYPECTOMY INTESTINAL;  Surgeon: DLucilla Lame MD;  Location[facility]  Service: Endoscopy;;  Transverse colon polyp   TONSILLECTOMY     Family History  Problem Relation Age of Onset   Thyroid cancer Mother 527  CAD Father  464  Cancer Sister 631      carcinoid of intestine   Breast cancer Neg Hx    Social History   Socioeconomic History   Marital status: Married    Spouse name: Not on file   Number of children: 3   Years of education: Not on file   Highest education level: Bachelor's degree (e.g., BA, AB, BS)  Occupational History   Occupation: retired  Tobacco Use   Smoking status: Former    Packs/day: 1.00    Years: 10.00    Pack years: 10.00    Types: Cigarettes   Smokeless tobacco: Never   Tobacco comments:    Smoking cessation materials not required  Vaping Use   Vaping Use: Never used  Substance and Sexual Activity   Alcohol use: Yes    Alcohol/week: 0.0 standard drinks    Comment: socially   Drug use: No   Sexual activity: Not on file  Other Topics Concern   Not on file  Social History Narrative   Not on file   Social Determinants of Health   Financial Resource Strain: Low Risk    Difficulty of Paying Living Expenses: Not hard at all  Food Insecurity: No Food Insecurity   Worried About RCalhounin the Last Year: Never true   RRicein the Last Year: Never  true  Transportation Needs: No Transportation Needs   Lack of Transportation (Medical): No   Lack of Transportation (Non-Medical): No  Physical Activity: Inactive   Days of Exercise per Week: 0 days   Minutes of Exercise per Session: 0 min  Stress: No Stress Concern Present   Feeling of Stress : Only a little  Social Connections: Moderately Integrated   Frequency of Communication with Friends and Family: More than three times a week   Frequency of Social Gatherings with Friends and Family: More than three times a week   Attends Religious Services: More than 4 times per year   Active Member of [employer] or Organizations: No   Attends Music therapist: Never   Marital Status: Married    Tobacco Counseling Counseling given: Not Answered Tobacco comments: Smoking cessation materials not  required   Clinical Intake:  Pre-visit preparation completed: Yes  Pain : No/denies pain     BMI - recorded: 23.45 Nutritional Status: BMI of 19-24  Normal Nutritional Risks: None Diabetes: No  How often do you need to have someone help you when you read instructions, pamphlets, or other written materials from your doctor or pharmacy?: 1 - Never   Interpreter Needed?: No  Information entered by :: Clemetine Marker LPN   Activities of Daily Living In your present state of health, do you have any difficulty performing the following activities: 08/29/2020 05/29/2020  Hearing? N N  Vision? N N  Difficulty concentrating or making decisions? N N  Walking or climbing stairs? N N  Dressing or bathing? N N  Doing errands, shopping? N N  Preparing Food and eating ? N -  Using the Toilet? N -  In the past six months, have you accidently leaked urine? N -  Do you have problems with loss of bowel control? N -  Managing your Medications? N -  Managing your Finances? N -  Housekeeping or managing your Housekeeping? N -  Some recent data might be hidden    Patient Care Team: Glean Hess, MD as PCP - General (Family Medicine)  Indicate any recent Medical Services you may have received from other than [facility] providers in the past year (date may be approximate).     Assessment:   This is a routine wellness examination for Shelly Thornton.  Hearing/Vision screen Hearing Screening - Comments:: Pt denies hearing difficulty Vision Screening - Comments:: [facility]  Dietary issues and exercise activities discussed: Current Exercise Habits: The patient does not participate in regular exercise at present, Exercise limited by: None identified   Goals Addressed             This Visit's Progress    Patient Stated       Patient states she would like to maintain health and activity level.        Depression Screen PHQ 2/9 Scores 08/29/2020 05/29/2020 04/25/2020  06/25/2017 02/11/2016  PHQ - 2 Score 0 0 0 0 0  PHQ- 9 Score 0 0 0 0 -    Fall Risk Fall Risk  08/29/2020 05/29/2020 04/25/2020 06/25/2017 02/11/2016  Falls in the past year? 0 0 0 No No  Number falls in past yr: 0 0 - - -  Injury with Fall? 0 0 - - -  Risk for fall due to : No Fall Risks - - - -  Follow up Falls prevention discussed Falls evaluation completed Falls evaluation completed - -    FALL RISK PREVENTION  PERTAINING TO THE HOME:  Any stairs in or around the home? Yes  If so, are there any without handrails? No  Home free of loose throw rugs in walkways, pet beds, electrical cords, etc? Yes  Adequate lighting in your home to reduce risk of falls? Yes   ASSISTIVE DEVICES UTILIZED TO PREVENT FALLS:  Life alert? No  Use of a cane, walker or w/c? No  Grab bars in the bathroom? Yes  Shower chair or bench in shower? No  Elevated toilet seat or a handicapped toilet? No   TIMED UP AND GO:  Was the test performed? Yes .  Length of time to ambulate 10 feet: 5 sec.   Gait steady and fast without use of assistive device  Cognitive Function:        Immunizations Immunization History  Administered Date(s) Administered   Hepatitis B, adult 01/28/1988   Tdap 06/25/2017   Tetanus 01/27/2001   Yellow Fever 08/27/2006    TDAP status: Up to date  Flu Vaccine status: Declined, Education has been provided regarding the importance of this vaccine but patient still declined. Advised may receive this vaccine at local pharmacy or Health Dept. Aware to provide a copy of the vaccination record if obtained from local pharmacy or Health Dept. Verbalized acceptance and understanding.  Pneumococcal vaccine status: Declined,  Education has been provided regarding the importance of this vaccine but patient still declined. Advised may receive this vaccine at local pharmacy or Health Dept. Aware to provide a copy of the vaccination record if obtained from local pharmacy or Health Dept. Verbalized  acceptance and understanding.   Covid-19 vaccine status: Declined, Education has been provided regarding the importance of this vaccine but patient still declined. Advised may receive this vaccine at local pharmacy or Health Dept.or vaccine clinic. Aware to provide a copy of the vaccination record if obtained from local pharmacy or Health Dept. Verbalized acceptance and understanding.  Qualifies for Shingles Vaccine? Yes   Zostavax completed No   Shingrix Completed?: No.    Education has been provided regarding the importance of this vaccine. Patient has been advised to call insurance company to determine out of pocket expense if they have not yet received this vaccine. Advised may also receive vaccine at local pharmacy or Health Dept. Verbalized acceptance and understanding.  Screening Tests Health Maintenance  Topic Date Due   COVID-19 Vaccine (1) Never done   Zoster Vaccines- Shingrix (1 of 2) Never done   DEXA SCAN  Never done   MAMMOGRAM  02/13/2019   INFLUENZA VACCINE  08/27/2020   PNA vac Low Risk Adult (1 of 2 - PCV13) 04/25/2021 (Originally 09/08/2018)   COLONOSCOPY (Pts 45-77yr Insurance coverage will need to be confirmed)  03/06/2021   TETANUS/TDAP  06/26/2027   Hepatitis C Screening  Completed   HPV VACCINES  Aged Out    Health Maintenance  Health Maintenance Due  Topic Date Due   COVID-19 Vaccine (1) Never done   Zoster Vaccines- Shingrix (1 of 2) Never done   DEXA SCAN  Never done   MAMMOGRAM  02/13/2019   INFLUENZA VACCINE  08/27/2020    Colorectal cancer screening: Type of screening: Colonoscopy. Completed 03/06/16. Repeat every 5 years  Mammogram status: Completed 02/12/17. Repeat every year. Ordered today.   Bone Density status: Ordered today. Pt provided with contact info and advised to call to schedule appt.  Lung Cancer Screening: (Low Dose CT Chest recommended if Age 67-80years, 30 pack-year currently smoking OR have  quit w/in 15years.) does not qualify.    Additional Screening:  Hepatitis C Screening: does qualify; Completed 02/11/16  Vision Screening: Recommended annual ophthalmology exams for early detection of glaucoma and other disorders of the eye. Is the patient up to date with their annual eye exam?  No  Who is the provider or what is the name of the office in which the patient attends annual eye exams? [facility].   Dental Screening: Recommended annual dental exams for proper oral hygiene  Community Resource Referral / Chronic Care Management: CRR required this visit?  No   CCM required this visit?  No      Plan:     I have personally reviewed and noted the following in the patient's chart:   Medical and social history Use of alcohol, tobacco or illicit drugs  Current medications and supplements including opioid prescriptions. Patient is not currently taking opioid prescriptions. Functional ability and status Nutritional status Physical activity Advanced directives List of other physicians Hospitalizations, surgeries, and ER visits in previous 12 months Vitals Screenings to include cognitive, depression, and falls Referrals and appointments  In addition, I have reviewed and discussed with patient certain preventive protocols, quality metrics, and best practice recommendations. A written personalized care plan for preventive services as well as general preventive health recommendations were provided to patient.     Clemetine Marker, LPN   [id number]   Nurse Notes: none

## 2020-08-29 NOTE — Patient Instructions (Signed)
Shelly Thornton , Thank you for taking time to come for your Medicare Wellness Visit. I appreciate your ongoing commitment to your health goals. Please review the following plan we discussed and let me know if I can assist you in the future.   Screening recommendations/referrals: Colonoscopy: done 03/16/16. Repeat 02/2021.  Mammogram: done 02/12/17. Please call [phone] to schedule your mammogram and bone density screening.  Bone Density: due Recommended yearly ophthalmology/optometry visit for glaucoma screening and checkup Recommended yearly dental visit for hygiene and checkup  Vaccinations: Influenza vaccine: declined Pneumococcal vaccine: declined Tdap vaccine: done 06/25/17 Shingles vaccine: Shingrix discussed. Please contact your pharmacy for coverage information.  Covid-19: declined   Advanced directives: Advance directive discussed with you today. Even though you declined this today please call our office should you change your mind and we can give you the proper paperwork for you to fill out.   Conditions/risks identified: Keep up the great work!  Next appointment: Follow up in one year for your annual wellness visit    Preventive Care 65 Years and Older, Female Preventive care refers to lifestyle choices and visits with your health care provider that can promote health and wellness. What does preventive care include? A yearly physical exam. This is also called an annual well check. Dental exams once or twice a year. Routine eye exams. Ask your health care provider how often you should have your eyes checked. Personal lifestyle choices, including: Daily care of your teeth and gums. Regular physical activity. Eating a healthy diet. Avoiding tobacco and drug use. Limiting alcohol use. Practicing safe sex. Taking low-dose aspirin every day. Taking vitamin and mineral supplements as recommended by your health care provider. What happens during an annual well check? The  services and screenings done by your health care provider during your annual well check will depend on your age, overall health, lifestyle risk factors, and family history of disease. Counseling  Your health care provider may ask you questions about your: Alcohol use. Tobacco use. Drug use. Emotional well-being. Home and relationship well-being. Sexual activity. Eating habits. History of falls. Memory and ability to understand (cognition). Work and work Statistician. Reproductive health. Screening  You may have the following tests or measurements: Height, weight, and BMI. Blood pressure. Lipid and cholesterol levels. These may be checked every 5 years, or more frequently if you are over 76 years old. Skin check. Lung cancer screening. You may have this screening every year starting at age 1 if you have a 30-pack-year history of smoking and currently smoke or have quit within the past 15 years. Fecal occult blood test (FOBT) of the stool. You may have this test every year starting at age 58. Flexible sigmoidoscopy or colonoscopy. You may have a sigmoidoscopy every 5 years or a colonoscopy every 10 years starting at age 56. Hepatitis C blood test. Hepatitis B blood test. Sexually transmitted disease (STD) testing. Diabetes screening. This is done by checking your blood sugar (glucose) after you have not eaten for a while (fasting). You may have this done every 1-3 years. Bone density scan. This is done to screen for osteoporosis. You may have this done starting at age 60. Mammogram. This may be done every 1-2 years. Talk to your health care provider about how often you should have regular mammograms. Talk with your health care provider about your test results, treatment options, and if necessary, the need for more tests. Vaccines  Your health care provider may recommend certain vaccines, such as: Influenza vaccine. This  is recommended every year. Tetanus, diphtheria, and acellular  pertussis (Tdap, Td) vaccine. You may need a Td booster every 10 years. Zoster vaccine. You may need this after age 37. Pneumococcal 13-valent conjugate (PCV13) vaccine. One dose is recommended after age 46. Pneumococcal polysaccharide (PPSV23) vaccine. One dose is recommended after age 21. Talk to your health care provider about which screenings and vaccines you need and how often you need them. This information is not intended to replace advice given to you by your health care provider. Make sure you discuss any questions you have with your health care provider. Document Released: 02/09/2015 Document Revised: 10/03/2015 Document Reviewed: 11/14/2014 Elsevier Interactive Patient Education  2017 [place] Prevention in the Home Falls can cause injuries. They can happen to people of all ages. There are many things you can do to make your home safe and to help prevent falls. What can I do on the outside of my home? Regularly fix the edges of walkways and driveways and fix any cracks. Remove anything that might make you trip as you walk through a door, such as a raised step or threshold. Trim any bushes or trees on the path to your home. Use bright outdoor lighting. Clear any walking paths of anything that might make someone trip, such as rocks or tools. Regularly check to see if handrails are loose or broken. Make sure that both sides of any steps have handrails. Any raised decks and porches should have guardrails on the edges. Have any leaves, snow, or ice cleared regularly. Use sand or salt on walking paths during winter. Clean up any spills in your garage right away. This includes oil or grease spills. What can I do in the bathroom? Use night lights. Install grab bars by the toilet and in the tub and shower. Do not use towel bars as grab bars. Use non-skid mats or decals in the tub or shower. If you need to sit down in the shower, use a plastic, non-slip stool. Keep the floor  dry. Clean up any water that spills on the floor as soon as it happens. Remove soap buildup in the tub or shower regularly. Attach bath mats securely with double-sided non-slip rug tape. Do not have throw rugs and other things on the floor that can make you trip. What can I do in the bedroom? Use night lights. Make sure that you have a light by your bed that is easy to reach. Do not use any sheets or blankets that are too big for your bed. They should not hang down onto the floor. Have a firm chair that has side arms. You can use this for support while you get dressed. Do not have throw rugs and other things on the floor that can make you trip. What can I do in the kitchen? Clean up any spills right away. Avoid walking on wet floors. Keep items that you use a lot in easy-to-reach places. If you need to reach something above you, use a strong step stool that has a grab bar. Keep electrical cords out of the way. Do not use floor polish or wax that makes floors slippery. If you must use wax, use non-skid floor wax. Do not have throw rugs and other things on the floor that can make you trip. What can I do with my stairs? Do not leave any items on the stairs. Make sure that there are handrails on both sides of the stairs and use them. Fix handrails that  are broken or loose. Make sure that handrails are as long as the stairways. Check any carpeting to make sure that it is firmly attached to the stairs. Fix any carpet that is loose or worn. Avoid having throw rugs at the top or bottom of the stairs. If you do have throw rugs, attach them to the floor with carpet tape. Make sure that you have a light switch at the top of the stairs and the bottom of the stairs. If you do not have them, ask someone to add them for you. What else can I do to help prevent falls? Wear shoes that: Do not have high heels. Have rubber bottoms. Are comfortable and fit you well. Are closed at the toe. Do not wear  sandals. If you use a stepladder: Make sure that it is fully opened. Do not climb a closed stepladder. Make sure that both sides of the stepladder are locked into place. Ask someone to hold it for you, if possible. Clearly mark and make sure that you can see: Any grab bars or handrails. First and last steps. Where the edge of each step is. Use tools that help you move around (mobility aids) if they are needed. These include: Canes. Walkers. Scooters. Crutches. Turn on the lights when you go into a dark area. Replace any light bulbs as soon as they burn out. Set up your furniture so you have a clear path. Avoid moving your furniture around. If any of your floors are uneven, fix them. If there are any pets around you, be aware of where they are. Review your medicines with your doctor. Some medicines can make you feel dizzy. This can increase your chance of falling. Ask your doctor what other things that you can do to help prevent falls. This information is not intended to replace advice given to you by your health care provider. Make sure you discuss any questions you have with your health care provider. Document Released: 11/09/2008 Document Revised: 06/21/2015 Document Reviewed: 02/17/2014 Elsevier Interactive Patient Education  2017 [employer].

## 2020-10-01 ENCOUNTER — Telehealth: Payer: Self-pay | Admitting: *Deleted

## 2020-10-01 NOTE — Telephone Encounter (Signed)
Already have [facility] appt schedule for 09-02-2020 for in person visit.

## 2020-10-04 ENCOUNTER — Telehealth: Payer: Self-pay | Admitting: Gastroenterology

## 2020-10-04 NOTE — Telephone Encounter (Signed)
Pt. Calling to schedule a colonoscopy

## 2020-10-05 ENCOUNTER — Other Ambulatory Visit: Payer: Self-pay

## 2020-10-05 DIAGNOSIS — Z86010 Personal history of colonic polyps: Secondary | ICD-10-CM

## 2020-10-05 MED ORDER — CLENPIQ 10-3.5-12 MG-GM -GM/160ML PO SOLN: 1.0000 | Freq: Once | ORAL | 0 refills | Status: AC

## 2020-10-05 NOTE — Progress Notes (Signed)
Gastroenterology Pre-Procedure Review  Request Date: 11/19/20 Requesting Physician: Dr. Allen Norris  PATIENT REVIEW QUESTIONS: The patient responded to the following health history questions as indicated:    1. Are you having any GI issues?  Diarrhea (sometimes) 2. Do you have a personal history of Polyps? yes (03/06/2016) 3. Do you have a family history of Colon Cancer or Polyps? no 4. Diabetes Mellitus? no 5. Joint replacements in the past 12 months?no 6. Major health problems in the past 3 months?no 7. Any artificial heart valves, MVP, or defibrillator?no    MEDICATIONS & ALLERGIES:    Patient reports the following regarding taking any anticoagulation/antiplatelet therapy:   Plavix, Coumadin, Eliquis, Xarelto, Lovenox, Pradaxa, Brilinta, or Effient? no Aspirin? no  Patient confirms/reports the following medications:  Current Outpatient Medications  Medication Sig Dispense Refill   Aspirin-Caffeine (BC FAST PAIN RELIEF PO) Take by mouth.     Probiotic Product (PROBIOTIC-10 PO) Take by mouth.     No current facility-administered medications for this visit.    Patient confirms/reports the following allergies:  Allergies  Allergen Reactions   Penicillins Rash    No orders of the defined types were placed in this encounter.   AUTHORIZATION INFORMATION Primary Insurance: 1D#: Group #:  Secondary Insurance: 1D#: Group #:  SCHEDULE INFORMATION: Date: [place] Time: Location: 11/19/20

## 2020-10-05 NOTE — Telephone Encounter (Signed)
Procedure has been scheduled for 11/19/20.

## 2020-11-05 ENCOUNTER — Encounter: Payer: Self-pay | Admitting: Gastroenterology

## 2020-11-19 ENCOUNTER — Ambulatory Visit
Admission: RE | Admit: 2020-11-19 | Discharge: 2020-11-19 | Disposition: A | Discharge status: Home / Self Care | Payer: Medicare PPO | Attending: Gastroenterology | Admitting: Gastroenterology

## 2020-11-19 ENCOUNTER — Other Ambulatory Visit: Payer: Self-pay

## 2020-11-19 ENCOUNTER — Ambulatory Visit: Payer: Medicare PPO | Admitting: Anesthesiology

## 2020-11-19 ENCOUNTER — Encounter
Admission: RE | Disposition: A | Discharge status: Home / Self Care | Payer: Self-pay | Source: Home / Self Care | Attending: Gastroenterology

## 2020-11-19 ENCOUNTER — Encounter: Payer: Self-pay | Admitting: Gastroenterology

## 2020-11-19 DIAGNOSIS — K641 Second degree hemorrhoids: Secondary | ICD-10-CM | POA: Diagnosis not present

## 2020-11-19 DIAGNOSIS — Z808 Family history of malignant neoplasm of other organs or systems: Secondary | ICD-10-CM | POA: Insufficient documentation

## 2020-11-19 DIAGNOSIS — Z1211 Encounter for screening for malignant neoplasm of colon: Secondary | ICD-10-CM | POA: Diagnosis not present

## 2020-11-19 DIAGNOSIS — K5730 Diverticulosis of large intestine without perforation or abscess without bleeding: Secondary | ICD-10-CM | POA: Diagnosis not present

## 2020-11-19 DIAGNOSIS — Z87891 Personal history of nicotine dependence: Secondary | ICD-10-CM | POA: Diagnosis not present

## 2020-11-19 DIAGNOSIS — Z86010 Personal history of colonic polyps: Secondary | ICD-10-CM | POA: Diagnosis not present

## 2020-11-19 DIAGNOSIS — Z800 Family history of malignant neoplasm of digestive organs: Secondary | ICD-10-CM | POA: Diagnosis not present

## 2020-11-19 HISTORY — PX: COLONOSCOPY WITH PROPOFOL: SHX5780

## 2020-11-19 HISTORY — DX: Unspecified osteoarthritis, unspecified site: M19.90

## 2020-11-19 SURGERY — COLONOSCOPY WITH PROPOFOL
Anesthesia: General | Site: Rectum

## 2020-11-19 MED ORDER — STERILE WATER FOR IRRIGATION IR SOLN
Status: DC | PRN
  Administered 2020-11-19: 1

## 2020-11-19 MED ORDER — LIDOCAINE HCL (CARDIAC) PF 100 MG/5ML IV SOSY
PREFILLED_SYRINGE | INTRAVENOUS | Status: DC | PRN
  Administered 2020-11-19: 50 mg via INTRAVENOUS

## 2020-11-19 MED ORDER — STERILE WATER FOR IRRIGATION IR SOLN
Status: DC | PRN
  Administered 2020-11-19: 120 mL

## 2020-11-19 MED ORDER — PROPOFOL 10 MG/ML IV BOLUS
INTRAVENOUS | Status: DC | PRN
  Administered 2020-11-19 (×2): 20 mg via INTRAVENOUS
  Administered 2020-11-19: 30 mg via INTRAVENOUS
  Administered 2020-11-19: 70 mg via INTRAVENOUS
  Administered 2020-11-19: 20 mg via INTRAVENOUS
  Administered 2020-11-19: 30 mg via INTRAVENOUS
  Administered 2020-11-19: 20 mg via INTRAVENOUS
  Administered 2020-11-19: 30 mg via INTRAVENOUS
  Administered 2020-11-19: 10 mg via INTRAVENOUS

## 2020-11-19 MED ORDER — SODIUM CHLORIDE 0.9 % IV SOLN: INTRAVENOUS | Status: DC

## 2020-11-19 MED ORDER — LACTATED RINGERS IV SOLN: INTRAVENOUS | Status: DC

## 2020-11-19 SURGICAL SUPPLY — 22 items

## 2020-11-19 NOTE — Anesthesia Preprocedure Evaluation (Signed)
Anesthesia Evaluation  Patient identified by MRN, date of birth, ID band Patient awake    History of Anesthesia Complications Negative for: history of anesthetic complications  Airway Mallampati: II  TM Distance: >3 FB Neck ROM: Full    Dental no notable dental hx.    Pulmonary former smoker,    Pulmonary exam normal        Cardiovascular Exercise Tolerance: Good negative cardio ROS Normal cardiovascular exam     Neuro/Psych negative neurological ROS     GI/Hepatic negative GI ROS, Neg liver ROS,   Endo/Other  negative endocrine ROS  Renal/GU negative Renal ROS     Musculoskeletal   Abdominal   Peds  Hematology negative hematology ROS (+)   Anesthesia Other Findings   Reproductive/Obstetrics                             Anesthesia Physical Anesthesia Plan  ASA: 1  Anesthesia Plan: General   Post-op Pain Management:    Induction: Intravenous  PONV Risk Score and Plan: 3 and TIVA, Propofol infusion and Treatment may vary due to age or medical condition  Airway Management Planned: Nasal Cannula and Natural Airway  Additional Equipment: None  Intra-op Plan:   Post-operative Plan:   Informed Consent: I have reviewed the patients History and Physical, chart, labs and discussed the procedure including the risks, benefits and alternatives for the proposed anesthesia with the patient or authorized representative who has indicated his/her understanding and acceptance.       Plan Discussed with: CRNA  Anesthesia Plan Comments:         Anesthesia Quick Evaluation

## 2020-11-19 NOTE — Anesthesia Procedure Notes (Signed)
Date/Time: 11/19/2020 10:26 AM Performed by: Mayme Genta, CRNA Pre-anesthesia Checklist: Patient identified, Emergency Drugs available, Suction available, Timeout performed and Patient being monitored Patient Re-evaluated:Patient Re-evaluated prior to induction Oxygen Delivery Method: Nasal cannula Placement Confirmation: positive ETCO2

## 2020-11-19 NOTE — H&P (Addendum)
Shelly Lame, MD [facility] [address], [place], [place] [address] Phone:[phone] Fax : [phone]  Primary Care Physician:  Glean Hess, MD Primary Gastroenterologist:  Dr. Allen Norris  Pre-Procedure History & Physical: HPI:  Shelly Thornton is a 65 y.o. female is here for an colonoscopy.   Past Medical History:  Diagnosis Date   Arthritis    Bursitis    left shoulder and right wrist   Family history of carcinoid tumor    Family history of thyroid cancer    Headache    occasional due muscle in shoulders possible migraine    Past Surgical History:  Procedure Laterality Date   AUGMENTATION MAMMAPLASTY     BREAST ENHANCEMENT SURGERY  1985   COLONOSCOPY  ~2005   @ [facility] - incomplete   COLONOSCOPY WITH PROPOFOL N/A 03/06/2016   Procedure: COLONOSCOPY WITH biopsy;  Surgeon: Shelly Lame, MD;  Location: [place];  Service: Endoscopy;  Laterality: N/A;   parathyroid surgery  2011   POLYPECTOMY  03/06/2016   Procedure: POLYPECTOMY INTESTINAL;  Surgeon: Shelly Lame, MD;  Location: [place];  Service: Endoscopy;;  Transverse colon polyp   TONSILLECTOMY      Prior to Admission medications   Medication Sig Start Date End Date Taking? Authorizing Provider  Aspirin-Caffeine (BC FAST PAIN RELIEF PO) Take by mouth.   Yes [provider]  Probiotic Product (PROBIOTIC-10 PO) Take by mouth.   Yes [provider]    Allergies as of 10/05/2020 - Review Complete 08/29/2020  Allergen Reaction Noted   Penicillins Rash 09/08/2014    Family History  Problem Relation Age of Onset   Thyroid cancer Mother 76   CAD Father 15   Cancer Sister 74       carcinoid of intestine   Breast cancer Neg Hx     Social History   Socioeconomic History   Marital status: Married    Spouse name: Not on file   Number of children: 3   Years of education: Not on file   Highest education level: Bachelor's degree (e.g., BA, AB, BS)  Occupational History    Occupation: retired  Tobacco Use   Smoking status: Former    Packs/day: 1.00    Years: 10.00    Pack years: 10.00    Types: Cigarettes   Smokeless tobacco: Never   Tobacco comments:    Smoking cessation materials not required  Vaping Use   Vaping Use: Never used  Substance and Sexual Activity   Alcohol use: Yes    Alcohol/week: 0.0 standard drinks    Comment: socially   Drug use: No   Sexual activity: Not on file  Other Topics Concern   Not on file  Social History Narrative   Not on file   Social Determinants of Health   Financial Resource Strain: Low Risk    Difficulty of Paying Living Expenses: Not hard at all  Food Insecurity: No Food Insecurity   Worried About Charity fundraiser in the Last Year: Never true   [place] in the Last Year: Never true  Transportation Needs: No Transportation Needs   Lack of Transportation (Medical): No   Lack of Transportation (Non-Medical): No  Physical Activity: Inactive   Days of Exercise per Week: 0 days   Minutes of Exercise per Session: 0 min  Stress: No Stress Concern Present   Feeling of Stress : Only a little  Social Connections: Moderately Integrated   Frequency of Communication  with Friends and Family: More than three times a week   Frequency of Social Gatherings with Friends and Family: More than three times a week   Attends Religious Services: More than 4 times per year   Active Member of [employer] or Organizations: No   Attends Music therapist: Never   Marital Status: Married  Human resources officer Violence: Not At Risk   Fear of Current or Ex-Partner: No   Emotionally Abused: No   Physically Abused: No   Sexually Abused: No    Review of Systems: See HPI, otherwise negative ROS  Physical Exam: BP (!) 120/51   Pulse 62   Temp 97.6 F (36.4 C)   Resp 18   Ht 5' 3.5" (1.613 m)   Wt 63 kg   SpO2 98%   BMI 24.24 kg/m  General:   Alert,  pleasant and cooperative in NAD Head:  Normocephalic and  atraumatic. Neck:  Supple; no masses or thyromegaly. Lungs:  Clear throughout to auscultation.    Heart:  Regular rate and rhythm. Abdomen:  Soft, nontender and nondistended. Normal bowel sounds, without guarding, and without rebound.   Neurologic:  Alert and  oriented x4;  grossly normal neurologically.  Impression/Plan: [name] is here for an colonoscopy to be performed for a history of adenomatous polyps on 2018  Risks, benefits, limitations, and alternatives regarding  colonoscopy have been reviewed with the patient.  Questions have been answered.  All parties agreeable.   Shelly Lame, MD  11/19/2020, 9:47 AM

## 2020-11-19 NOTE — Op Note (Signed)
[facility] Gastroenterology Patient Name: Shelly Thornton Procedure Date: 11/19/2020 10:17 AM MRN: [id number] Account #: [id number] Date of Birth: Jun 24, 1955 Admit Type: Outpatient Age: 65 Room: [facility] OR ROOM 01 Gender: Female Note Status: Finalized Instrument Name: [id number] Procedure:             Colonoscopy Indications:           High risk colon cancer surveillance: Personal history                         of colonic polyps Providers:             Lucilla Lame MD, MD Referring MD:          Halina Maidens, MD (Referring MD) Medicines:             Propofol per Anesthesia Complications:         No immediate complications. Procedure:             Pre-Anesthesia Assessment:                        - Prior to the procedure, a History and Physical was                         performed, and patient medications and allergies were                         reviewed. The patient's tolerance of previous                         anesthesia was also reviewed. The risks and benefits                         of the procedure and the sedation options and risks                         were discussed with the patient. All questions were                         answered, and informed consent was obtained. Prior                         Anticoagulants: The patient has taken no previous                         anticoagulant or antiplatelet agents. ASA Grade                         Assessment: II - A patient with mild systemic disease.                         After reviewing the risks and benefits, the patient                         was deemed in satisfactory condition to undergo the                         procedure.  After obtaining informed consent, the colonoscope was                         passed under direct vision. Throughout the procedure,                         the patient's blood pressure, pulse, and oxygen                         saturations were monitored  continuously. The                         Colonoscope was introduced through the anus and                         advanced to the the cecum, identified by appendiceal                         orifice and ileocecal valve. The colonoscopy was                         performed without difficulty. The patient tolerated                         the procedure well. The quality of the bowel                         preparation was excellent. Findings:      The perianal and digital rectal examinations were normal.      Multiple small-mouthed diverticula were found in the sigmoid colon.      Non-bleeding internal hemorrhoids were found during retroflexion. The       hemorrhoids were Grade II (internal hemorrhoids that prolapse but reduce       spontaneously). Impression:            - Diverticulosis in the sigmoid colon.                        - Non-bleeding internal hemorrhoids.                        - No specimens collected. Recommendation:        - Discharge patient to home.                        - Resume previous diet.                        - Continue present medications.                        - Repeat colonoscopy in 7 years for surveillance. Procedure Code(s):     --- Professional ---                        [phone], Colonoscopy, flexible; diagnostic, including                         collection of specimen(s) by brushing or washing, when  performed (separate procedure) Diagnosis Code(s):     --- Professional ---                        Z86.010, Personal history of colonic polyps CPT copyright 2019 American Medical Association. All rights reserved. The codes documented in this report are preliminary and upon coder review may  be revised to meet current compliance requirements. Lucilla Lame MD, MD 11/19/2020 10:43:40 AM This report has been signed electronically. Number of Addenda: 0 Note Initiated On: 11/19/2020 10:17 AM Scope Withdrawal Time: 0 hours 8 minutes 11  seconds  Total Procedure Duration: 0 hours 13 minutes 0 seconds  Estimated Blood Loss:  Estimated blood loss: none.      [facility]

## 2020-11-19 NOTE — Anesthesia Postprocedure Evaluation (Signed)
Anesthesia Post Note  Patient: Shelly Thornton  Procedure(s) Performed: COLONOSCOPY WITH PROPOFOL (Rectum)     Patient location during evaluation: PACU Anesthesia Type: General Level of consciousness: awake and alert Pain management: pain level controlled Vital Signs Assessment: post-procedure vital signs reviewed and stable Respiratory status: spontaneous breathing, nonlabored ventilation, respiratory function stable and patient connected to nasal cannula oxygen Cardiovascular status: blood pressure returned to baseline and stable Postop Assessment: no apparent nausea or vomiting Anesthetic complications: no   No notable events documented.  Adele Barthel Kaoru Rezendes

## 2020-11-19 NOTE — Transfer of Care (Signed)
Immediate Anesthesia Transfer of Care Note  Patient: Shelly Thornton  Procedure(s) Performed: COLONOSCOPY WITH PROPOFOL (Rectum)  Patient Location: PACU  Anesthesia Type: General  Level of Consciousness: awake, alert  and patient cooperative  Airway and Oxygen Therapy: Patient Spontanous Breathing and Patient connected to supplemental oxygen  Post-op Assessment: Post-op Vital signs reviewed, Patient's Cardiovascular Status Stable, Respiratory Function Stable, Patent Airway and No signs of Nausea or vomiting  Post-op Vital Signs: Reviewed and stable  Complications: No notable events documented.

## 2020-11-21 ENCOUNTER — Encounter: Payer: Self-pay | Admitting: Gastroenterology

## 2021-02-18 ENCOUNTER — Ambulatory Visit
Admission: RE | Admit: 2021-02-18 | Discharge: 2021-02-18 | Disposition: A | Discharge status: Home / Self Care | Payer: Medicare PPO | Source: Ambulatory Visit | Attending: Internal Medicine | Admitting: Internal Medicine

## 2021-02-18 ENCOUNTER — Other Ambulatory Visit: Payer: Self-pay

## 2021-02-18 DIAGNOSIS — M810 Age-related osteoporosis without current pathological fracture: Secondary | ICD-10-CM | POA: Diagnosis not present

## 2021-02-18 DIAGNOSIS — M8588 Other specified disorders of bone density and structure, other site: Secondary | ICD-10-CM | POA: Diagnosis not present

## 2021-02-18 DIAGNOSIS — Z780 Asymptomatic menopausal state: Secondary | ICD-10-CM | POA: Diagnosis not present

## 2021-06-05 ENCOUNTER — Ambulatory Visit: Payer: Medicare PPO | Admitting: Internal Medicine

## 2021-06-05 ENCOUNTER — Encounter: Payer: Self-pay | Admitting: Internal Medicine

## 2021-06-05 VITALS — BP 104/60 | HR 67 | Ht 63.5 in | Wt 150.4 lb

## 2021-06-05 DIAGNOSIS — T7840XA Allergy, unspecified, initial encounter: Secondary | ICD-10-CM

## 2021-06-05 DIAGNOSIS — R600 Localized edema: Secondary | ICD-10-CM

## 2021-06-05 MED ORDER — PREDNISONE 10 MG PO TABS: 10.0000 mg | ORAL_TABLET | ORAL | 0 refills | Status: AC

## 2021-06-05 NOTE — Progress Notes (Signed)
? ? ?Date:  06/05/2021  ? ?Name:  Shelly Thornton   DOB:  Jul 14, 1955   MRN:  [id number] ? ? ?Chief Complaint: Rash ? ?Rash ?This is a new problem. The current episode started yesterday. The problem has been gradually worsening since onset. The affected locations include the left hand, right hand and left arm. The rash is characterized by itchiness. She was exposed to plant contact (Worked in the yard yesterday with [employer].). Pertinent negatives include no congestion, fatigue or fever.  ? ?Lab Results  ?Component Value Date  ? NA 140 02/11/2016  ? K 4.3 02/11/2016  ? CO2 28 02/11/2016  ? GLUCOSE 88 02/11/2016  ? BUN 18 02/11/2016  ? CREATININE 0.84 02/11/2016  ? CALCIUM 9.4 02/11/2016  ? GFRNONAA 75 02/11/2016  ? ?Lab Results  ?Component Value Date  ? CHOL 259 (H) 02/11/2016  ? HDL 105 02/11/2016  ? LDLCALC 133 (H) 02/11/2016  ? TRIG 103 02/11/2016  ? CHOLHDL 2.5 02/11/2016  ? ?Lab Results  ?Component Value Date  ? TSH 3.490 02/11/2016  ? ?No results found for: HGBA1C ?Lab Results  ?Component Value Date  ? WBC 7.4 02/11/2016  ? HGB 13.6 02/11/2016  ? HCT 40.0 02/11/2016  ? MCV 92 02/11/2016  ? PLT 267 02/11/2016  ? ?Lab Results  ?Component Value Date  ? ALT 19 02/11/2016  ? AST 20 02/11/2016  ? ALKPHOS 63 02/11/2016  ? BILITOT 0.6 02/11/2016  ? ?No results found for: 25OHVITD2, [place], VD25OH  ? ?Review of Systems  ?Constitutional:  Negative for chills, fatigue and fever.  ?HENT:  Negative for congestion and facial swelling.   ?Musculoskeletal:  Positive for joint swelling (swelling of both hands).  ?Skin:  Positive for rash.  ?Neurological:  Negative for light-headedness and numbness.  ? ?Patient Active Problem List  ? Diagnosis Date Noted  ? History of colonic polyps   ? Genetic testing 08/03/2020  ? Family history of carcinoid tumor 07/19/2020  ? Family history of thyroid cancer 07/19/2020  ? Adenomatous colon polyp 03/10/2016  ? HPV (human papilloma virus) infection 02/14/2016  ? Carpal tunnel syndrome 02/07/2016   ? ? ?Allergies  ?Allergen Reactions  ? Penicillins Rash  ? ? ?Past Surgical History:  ?Procedure Laterality Date  ? AUGMENTATION MAMMAPLASTY    ? [place]  ? COLONOSCOPY  ~2005  ? @ [facility] - incomplete  ? COLONOSCOPY WITH PROPOFOL N/A 03/06/2016  ? Procedure: COLONOSCOPY WITH biopsy;  Surgeon: Lucilla Lame, MD;  Location: [place];  Service: Endoscopy;  Laterality: N/A;  ? COLONOSCOPY WITH PROPOFOL N/A 11/19/2020  ? Procedure: COLONOSCOPY WITH PROPOFOL;  Surgeon: Lucilla Lame, MD;  Location: [place];  Service: Endoscopy;  Laterality: N/A;  ? parathyroid surgery  2011  ? POLYPECTOMY  03/06/2016  ? Procedure: POLYPECTOMY INTESTINAL;  Surgeon: Lucilla Lame, MD;  Location: [place];  Service: Endoscopy;;  Transverse colon polyp  ? TONSILLECTOMY    ? ? ?Social History  ? ?Tobacco Use  ? Smoking status: Former  ?  Packs/day: 1.00  ?  Years: 10.00  ?  Pack years: 10.00  ?  Types: Cigarettes  ? Smokeless tobacco: Never  ? Tobacco comments:  ?  Smoking cessation materials not required  ?Vaping Use  ? Vaping Use: Never used  ?Substance Use Topics  ? Alcohol use: Yes  ?  Alcohol/week: 0.0 standard drinks  ?  Comment: socially  ? Drug use: No  ? ? ? ?Medication list has  been reviewed and updated. ? ?Current Meds  ?Medication Sig  ? Aspirin-Caffeine (BC FAST PAIN RELIEF PO) Take by mouth.  ? Probiotic Product (PROBIOTIC-10 PO) Take by mouth.  ? ? ? ?  05/29/2020  ?  8:37 AM 04/25/2020  ? 11:22 AM  ?GAD 7 : Generalized Anxiety Score  ?Nervous, Anxious, on Edge 0 0  ?Control/stop worrying 0 0  ?Worry too much - different things 0 0  ?Trouble relaxing 0 0  ?Restless 0 0  ?Easily annoyed or irritable 0 0  ?Afraid - awful might happen 0 0  ?Total GAD 7 Score 0 0  ? ? ? ?  08/29/2020  ?  3:39 PM  ?Depression screen PHQ 2/9  ?Decreased Interest 0  ?Down, Depressed, Hopeless 0  ?PHQ - 2 Score 0  ?Altered sleeping 0  ?Tired, decreased energy 0  ?Change in appetite 0  ?Feeling bad or failure  about yourself  0  ?Trouble concentrating 0  ?Moving slowly or fidgety/restless 0  ?Suicidal thoughts 0  ?PHQ-9 Score 0  ?Difficult doing work/chores Not difficult at all  ? ? ?BP Readings from Last 3 Encounters:  ?06/05/21 104/60  ?11/19/20 111/72  ?08/29/20 108/72  ? ? ?Physical Exam ?HENT:  ?   Mouth/Throat:  ?   Lips: Pink. No lesions.  ?   Mouth: No angioedema.  ?Cardiovascular:  ?   Rate and Rhythm: Normal rate and regular rhythm.  ?Pulmonary:  ?   Effort: Pulmonary effort is normal.  ?   Breath sounds: No wheezing or rhonchi.  ?Musculoskeletal:  ?   Right hand: Swelling present.  ?   Left hand: Swelling present.  ?   Cervical back: Normal range of motion.  ?   Comments: Swelling of dorsum of both hands with warmth and redness; redness on the inner forearm with mild edema ?No puncture wound or insect bites ?No lymph streaking  ?Lymphadenopathy:  ?   Cervical: No cervical adenopathy.  ? ? ?Wt Readings from Last 3 Encounters:  ?06/05/21 150 lb 6.4 oz (68.2 kg)  ?11/19/20 139 lb (63 kg)  ?08/29/20 136 lb 9.6 oz (62 kg)  ? ? ?BP 104/60   Pulse 67   Ht 5' 3.5" (1.613 m)   Wt 150 lb 6.4 oz (68.2 kg)   SpO2 97%   BMI 26.22 kg/m?  ? ?Assessment and Plan: ?1. Localized edema ?Recommend elevation ? ?2. Allergic reaction, initial encounter ?Continue Benadryl qid ?Allegra or Claritin daily ?Call if worsening ?- predniSONE (DELTASONE) 10 MG tablet; Take 1 tablet (10 mg total) by mouth as directed for 6 days. Take 6,5,4,3,2,1 then stop  Dispense: 21 tablet; Refill: 0 ? ? ?Partially dictated using Editor, commissioning. Any errors are unintentional. ? ?Halina Maidens, MD ?[facility] ?[facility] ? ?06/05/2021 ? ? ? ? ? ?

## 2021-07-04 ENCOUNTER — Ambulatory Visit: Payer: Self-pay | Admitting: *Deleted

## 2021-07-04 NOTE — Telephone Encounter (Addendum)
  Chief Complaint: Chest tightness Symptoms: Cough, "Constant, worse at night" mostly dry,chest tightness Frequency: Yesterday Pertinent Negatives: Patient denies fever, SOB, wheezing. Disposition: '[]'$ ED /'[]'$ Urgent Care (no appt availability in office) / '[]'$ Appointment(In office/virtual)/ '[]'$  [facility]/ '[]'$ Home Care/ '[x]'$ Refused Recommended Disposition /'[]'$ [facility]/ '[]'$  Follow-up with PCP Additional Notes: Requesting Inhaler, has had in past, not on current med profile. Advised would probably need appt, attempted to secure, declines. Offered virtual appt, declines. NT expressed concern over chest tightness "pressure"  Pt states "If she'll call inhaler in fine, if not and I get worse I'll go to UC." Again offered appt, declines "This is getting complicated."  Assured pt NT would route to practice for PCPs review and final disposition.   Called practice, [facility], to alert of situation. Pt seemed frustrated. Reason for Disposition  [1] Continuous (nonstop) coughing interferes with work or school AND [2] no improvement using cough treatment per Care Advice  Answer Assessment - Initial Assessment Questions 1. ONSET: "When did the cough begin?"      yesterday 2. SEVERITY: "How bad is the cough today?"     Spells during day,  3. SPUTUM: "Describe the color of your sputum" (none, dry cough; clear, white, yellow, green)     Dry 4. HEMOPTYSIS: "Are you coughing up any blood?" If so ask: "How much?" (flecks, streaks, tablespoons, etc.)     no 5. DIFFICULTY BREATHING: "Are you having difficulty breathing?" If Yes, ask: "How bad is it?" (e.g., mild, moderate, severe)    - MILD: No SOB at rest, mild SOB with walking, speaks normally in sentences, can lie down, no retractions, pulse < 100.    - MODERATE: SOB at rest, SOB with minimal exertion and prefers to sit, cannot lie down flat, speaks in phrases, mild retractions, audible wheezing, pulse 100-120.    - SEVERE: Very SOB at rest,  speaks in single words, struggling to breathe, sitting hunched forward, retractions, pulse > 120      Mild with exertion 6. FEVER: "Do you have a fever?" If Yes, ask: "What is your temperature, how was it measured, and when did it start?"     No 7. CARDIAC HISTORY: "Do you have any history of heart disease?" (e.g., heart attack, congestive heart failure)       8. LUNG HISTORY: "Do you have any history of lung disease?"  (e.g., pulmonary embolus, asthma, emphysema)      9. PE RISK FACTORS: "Do you have a history of blood clots?" (or: recent major surgery, recent prolonged travel, bedridden)      10. OTHER SYMPTOMS: "Do you have any other symptoms?" (e.g., runny nose, wheezing, chest pain)       Chest tightness,constant  Protocols used: Cough - Acute Non-Productive-A-AH

## 2021-07-05 ENCOUNTER — Other Ambulatory Visit: Payer: Self-pay

## 2021-07-05 DIAGNOSIS — J40 Bronchitis, not specified as acute or chronic: Secondary | ICD-10-CM

## 2021-07-05 MED ORDER — ALBUTEROL SULFATE HFA 108 (90 BASE) MCG/ACT IN AERS: 2.0000 | INHALATION_SPRAY | Freq: Four times a day (QID) | RESPIRATORY_TRACT | 0 refills | Status: AC | PRN

## 2021-07-05 NOTE — Telephone Encounter (Signed)
Refilled inhaler - Albuterol generic to [facility].

## 2021-09-02 ENCOUNTER — Ambulatory Visit: Payer: Medicare PPO

## 2021-09-05 ENCOUNTER — Ambulatory Visit
Admission: RE | Admit: 2021-09-05 | Discharge: 2021-09-05 | Disposition: A | Discharge status: Home / Self Care | Payer: Medicare PPO | Source: Ambulatory Visit | Attending: Internal Medicine | Admitting: Internal Medicine

## 2021-09-05 DIAGNOSIS — Z1231 Encounter for screening mammogram for malignant neoplasm of breast: Secondary | ICD-10-CM | POA: Diagnosis not present

## 2021-09-20 ENCOUNTER — Ambulatory Visit: Payer: Medicare PPO

## 2021-09-27 ENCOUNTER — Ambulatory Visit (INDEPENDENT_AMBULATORY_CARE_PROVIDER_SITE_OTHER): Payer: Medicare PPO

## 2021-09-27 VITALS — BP 110/68 | Temp 97.8°F | Ht 63.5 in | Wt 154.6 lb

## 2021-09-27 DIAGNOSIS — Z0000 Encounter for general adult medical examination without abnormal findings: Secondary | ICD-10-CM | POA: Diagnosis not present

## 2021-09-27 NOTE — Progress Notes (Signed)
Subjective:   Shelly Thornton is a 66 y.o. female who presents for Medicare Annual (Subsequent) preventive examination.  Review of Systems    Per HPI unless specifically indicated above   Cardiac Risk Factors include: advanced age (>76mn, >>2women);female gender          Objective:       09/27/2021    1:46 PM 06/05/2021   10:05 AM 11/19/2020   10:55 AM  Vitals with BMI  Height 5' 3.5" 5' 3.5"   Weight 154 lbs 10 oz 150 lbs 6 oz   BMI [phone]  Systolic [id number] Diastolic 68 60 72  Pulse  67 65    Today's Vitals   09/27/21 1346  BP: 110/68  Temp: 97.8 F (36.6 C)  TempSrc: Oral  SpO2: 98%  Weight: 154 lb 9.6 oz (70.1 kg)  Height: 5' 3.5" (1.613 m)  PainSc: 0-No pain   Body mass index is 26.96 kg/m.     11/19/2020    9:09 AM 08/29/2020    3:40 PM 09/25/2018    4:12 AM 08/29/2017    9:51 AM 03/06/2016    7:48 AM 02/11/2016    8:36 AM  Advanced Directives  Does Patient Have a Medical Advance Directive? No No No No No No  Would patient like information on creating a medical advance directive? No - Patient declined No - Patient declined   No - Patient declined No - Patient declined    Current Medications (verified) Outpatient Encounter Medications as of 09/27/2021  Medication Sig   albuterol (VENTOLIN HFA) 108 (90 Base) MCG/ACT inhaler Inhale 2 puffs into the lungs every 6 (six) hours as needed for wheezing or shortness of breath.   Aspirin-Caffeine (BC FAST PAIN RELIEF PO) Take by mouth.   Probiotic Product (PROBIOTIC-10 PO) Take by mouth.   No facility-administered encounter medications on file as of 09/27/2021.    Allergies (verified) Penicillins   History: Past Medical History:  Diagnosis Date   Arthritis    Bursitis    left shoulder and right wrist   Family history of carcinoid tumor    Family history of thyroid cancer    Headache    occasional due muscle in shoulders possible migraine   Past Surgical History:  Procedure Laterality Date    AUGMENTATION MAMMAPLASTY     BREAST ENHANCEMENT SURGERY  1985   COLONOSCOPY  ~2005   @ [facility] - incomplete   COLONOSCOPY WITH PROPOFOL N/A 03/06/2016   Procedure: COLONOSCOPY WITH biopsy;  Surgeon: DLucilla Lame MD;  [facility] [facility]  Service: Endoscopy;  Laterality: N/A;   COLONOSCOPY WITH PROPOFOL N/A 11/19/2020   Procedure: COLONOSCOPY WITH PROPOFOL;  Surgeon: WLucilla Lame MD;  Location[place] [facility]  Service: Endoscopy;  Laterality: N/A;   parathyroid surgery  2011   POLYPECTOMY  03/06/2016   Procedure: POLYPECTOMY INTESTINAL;  Surgeon: DLucilla Lame MD;  [place] [facility]  Service: Endoscopy;;  Transverse colon polyp   TONSILLECTOMY     Family History  Problem Relation Age of Onset   Thyroid cancer Mother 586  CAD Father 416  Cancer Sister 629      carcinoid of intestine   Breast cancer Neg Hx    Social History   Socioeconomic History   Marital status: Married    Spouse name: RYeni Thornton  Number of children: 3   Years of education: Not on file   Highest education level: Bachelor's degree (  e.g., BA, AB, BS)  Occupational History   Occupation: retired  Tobacco Use   Smoking status: Former    Packs/day: 1.00    Years: 10.00    Total pack years: 10.00    Types: Cigarettes   Smokeless tobacco: Never   Tobacco comments:    Smoking cessation materials not required  Vaping Use   Vaping Use: Never used  Substance and Sexual Activity   Alcohol use: Yes    Alcohol/week: 0.0 standard drinks of alcohol    Comment: socially   Drug use: No   Sexual activity: Not on file  Other Topics Concern   Not on file  Social History Narrative   Not on file   Social Determinants of Health   Financial Resource Strain: Low Risk  (09/27/2021)   Overall Financial Resource Strain (CARDIA)    Difficulty of Paying Living Expenses: Not hard at all  Food Insecurity: No Food Insecurity (09/27/2021)   Hunger Vital Sign    Worried About Running Out of Food in the  Last Year: Never true    Ran Out of Food in the Last Year: Never true  Transportation Needs: No Transportation Needs (09/27/2021)   PRAPARE - Hydrologist (Medical): No    Lack of Transportation (Non-Medical): No  Physical Activity: Insufficiently Active (09/27/2021)   Exercise Vital Sign    Days of Exercise per Week: 1 day    Minutes of Exercise per Session: 60 min  Stress: No Stress Concern Present (08/29/2020)   [place]    Feeling of Stress : Only a little  Social Connections: Socially Integrated (09/27/2021)   Social Connection and Isolation Panel [NHANES]    Frequency of Communication with Friends and Family: More than three times a week    Frequency of Social Gatherings with Friends and Family: Once a week    Attends Religious Services: More than 4 times per year    Active Member of [employer] or Organizations: Yes    Attends Music therapist: More than 4 times per year    Marital Status: Married    Tobacco Counseling Counseling given: Not Answered Tobacco comments: Smoking cessation materials not required   Clinical Intake:  Pre-visit preparation completed: No  Pain : 0-10 Pain Score: 0-No pain     Nutritional Status: BMI 25 -29 Overweight Nutritional Risks: None Diabetes: Yes CBG done?: No Did pt. bring in CBG monitor from home?: No  How often do you need to have someone help you when you read instructions, pamphlets, or other written materials from your doctor or pharmacy?: 1 - Never  Diabetic?No   Interpreter Needed?: No  Information entered by :: Donnie Mesa, CMA   Activities of Daily Living    09/27/2021    1:50 PM 11/19/2020    9:00 AM  In your present state of health, do you have any difficulty performing the following activities:  Hearing? 0 0  Vision? 1 0  Difficulty concentrating or making decisions? 0 0  Walking or climbing stairs? 0 0   Dressing or bathing? 0 0  Doing errands, shopping? 0     Patient Care Team: Glean Hess, MD as PCP - General (Family Medicine)  Indicate any recent Medical Services you may have received from other than [facility] providers in the past year (date may be approximate).     Assessment:   This is a routine wellness examination for Shelly Thornton.  Hearing/Vision screen Pt denies any hearing issues. Denies any vision issues. Does not get annual eye exam. Recommended that she should get an Annual Eye exam.   Dietary issues and exercise activities discussed: Current Exercise Habits: Structured exercise class, Time (Minutes): 45, Frequency (Times/Week): 1, Weekly Exercise (Minutes/Week): 45, Intensity: Moderate   Goals Addressed   None    Depression Screen    09/27/2021    1:50 PM 06/05/2021   10:11 AM 08/29/2020    3:39 PM 05/29/2020    8:37 AM 04/25/2020   11:22 AM 06/25/2017   10:28 AM 02/11/2016    8:37 AM  PHQ 2/9 Scores  PHQ - 2 Score 0 0 0 0 0 0 0  PHQ- 9 Score 0 0 0 0 0 0     Fall Risk    09/27/2021    1:50 PM 06/05/2021   10:11 AM 08/29/2020    3:42 PM 05/29/2020    8:39 AM 04/25/2020   11:22 AM  Fall Risk   Falls in the past year? 0 0 0 0 0  Number falls in past yr: 0 0 0 0   Injury with Fall? 0 0 0 0   Risk for fall due to : No Fall Risks No Fall Risks No Fall Risks    Follow up Falls evaluation completed Falls evaluation completed Falls prevention discussed Falls evaluation completed Falls evaluation completed    [place]:  Any stairs in or around the home? No  If so, are there any without handrails? Yes  Home free of loose throw rugs in walkways, pet beds, electrical cords, etc? Yes  Adequate lighting in your home to reduce risk of falls? Yes   ASSISTIVE DEVICES UTILIZED TO PREVENT FALLS:  Life alert? No  Use of a cane, walker or w/c? No  Grab bars in the bathroom? Yes  Shower chair or bench in shower? No  Elevated toilet seat or a  handicapped toilet? Yes   TIMED UP AND GO:  Was the test performed? Yes .  Length of time to ambulate 10 feet: 10  sec.   Gait steady and fast without use of assistive device  Cognitive Function:        09/27/2021    1:51 PM  6CIT Screen  What Year? 0 points  What month? 0 points  What time? 0 points  Count back from 20 0 points  Months in reverse 0 points  Repeat phrase 0 points  Total Score 0 points    Immunizations Immunization History  Administered Date(s) Administered   Hepatitis B, adult 01/28/1988   Tdap 06/25/2017   Tetanus 01/27/2001   Yellow Fever 08/27/2006    TDAP status: Up to date  Flu Vaccine status: Due, Education has been provided regarding the importance of this vaccine. Advised may receive this vaccine at local pharmacy or Health Dept. Aware to provide a copy of the vaccination record if obtained from local pharmacy or Health Dept. Verbalized acceptance and understanding.  Pneumococcal vaccine status: Declined,  Education has been provided regarding the importance of this vaccine but patient still declined. Advised may receive this vaccine at local pharmacy or Health Dept. Aware to provide a copy of the vaccination record if obtained from local pharmacy or Health Dept. Verbalized acceptance and understanding.   Covid-19 vaccine status: Declined, Education has been provided regarding the importance of this vaccine but patient still declined. Advised may receive this vaccine at local pharmacy or Health Dept.or  vaccine clinic. Aware to provide a copy of the vaccination record if obtained from local pharmacy or Health Dept. Verbalized acceptance and understanding.  Qualifies for Shingles Vaccine? Yes   Zostavax completed No   Shingrix Completed?: No.    Education has been provided regarding the importance of this vaccine. Patient has been advised to call insurance company to determine out of pocket expense if they have not yet received this vaccine. Advised  may also receive vaccine at local pharmacy or Health Dept. Verbalized acceptance and understanding.  Screening Tests Health Maintenance  Topic Date Due   INFLUENZA VACCINE  Never done   COVID-19 Vaccine (1) 10/13/2021 (Originally 03/10/1956)   Zoster Vaccines- Shingrix (1 of 2) 12/26/2021 (Originally 09/08/2003)   Pneumonia Vaccine 74+ Years old (1 - PCV) 09/28/2022 (Originally 09/08/2018)   MAMMOGRAM  09/06/2023   TETANUS/TDAP  06/26/2027   COLONOSCOPY (Pts 45-39yr Insurance coverage will need to be confirmed)  11/20/2027   DEXA SCAN  Completed   Hepatitis C Screening  Completed   HPV VACCINES  Aged Out    Health Maintenance  Health Maintenance Due  Topic Date Due   INFLUENZA VACCINE  Never done    Colorectal cancer screening: Type of screening: Colonoscopy. Completed 11/19/2020. Repeat every 7 years  Mammogram status: Completed 09/05/2021. Repeat every year  DEXA Scan: 02/18/2021  Lung Cancer Screening: (Low Dose CT Chest recommended if Age 68-80years, 30 pack-year currently smoking OR have quit w/in 15years.) does not qualify.   Lung Cancer Screening Referral: does not qualify   Additional Screening:  Hepatitis C Screening: does qualify; Completed 02/11/2016  Vision Screening: Recommended annual ophthalmology exams for early detection of glaucoma and other disorders of the eye. Is the patient up to date with their annual eye exam?  Yes  Who is the provider or what is the name of the office in which the patient attends annual eye exams?  If pt is not established with a provider, would they like to be referred to a provider to establish care? No .   Dental Screening: Recommended annual dental exams for proper oral hygiene  Community Resource Referral / Chronic Care Management: CRR required this visit?  No   CCM required this visit?  No      Plan:     I have personally reviewed and noted the following in the patient's chart:   Medical and social history Use of  alcohol, tobacco or illicit drugs  Current medications and supplements including opioid prescriptions. Patient is not currently taking opioid prescriptions. Functional ability and status Nutritional status Physical activity Advanced directives List of other physicians Hospitalizations, surgeries, and ER visits in previous 12 months Vitals Screenings to include cognitive, depression, and falls Referrals and appointments  In addition, I have reviewed and discussed with patient certain preventive protocols, quality metrics, and best practice recommendations. A written personalized care plan for preventive services as well as general preventive health recommendations were provided to patient.    Ms. HGoodall, Thank you for taking time to come for your Medicare Wellness Visit. I appreciate your ongoing commitment to your health goals. Please review the following plan we discussed and let me know if I can assist you in the future.   These are the goals we discussed:  Goals      Patient Stated     Patient states she would like to maintain health and activity level.         This is a list of the  screening recommended for you and due dates:  Health Maintenance  Topic Date Due   Flu Shot  Never done   COVID-19 Vaccine (1) 10/13/2021*   Zoster (Shingles) Vaccine (1 of 2) 12/26/2021*   Pneumonia Vaccine (1 - PCV) 09/28/2022*   Mammogram  09/06/2023   Tetanus Vaccine  06/26/2027   Colon Cancer Screening  11/20/2027   DEXA scan (bone density measurement)  Completed   Hepatitis C Screening: USPSTF Recommendation to screen - Ages 74-79 yo.  Completed   HPV Vaccine  Aged Out  *Topic was postponed. The date shown is not the original due date.     Wilson Singer, [place]   09/27/2021   Nurse Notes: Approximately 30 minute Face-to-face visit

## 2021-09-27 NOTE — Patient Instructions (Signed)

## 2023-02-23 DIAGNOSIS — H01005 Unspecified blepharitis left lower eyelid: Secondary | ICD-10-CM | POA: Diagnosis not present

## 2023-02-23 DIAGNOSIS — H2513 Age-related nuclear cataract, bilateral: Secondary | ICD-10-CM | POA: Diagnosis not present

## 2023-02-23 DIAGNOSIS — H01002 Unspecified blepharitis right lower eyelid: Secondary | ICD-10-CM | POA: Diagnosis not present

## 2023-02-23 DIAGNOSIS — H04129 Dry eye syndrome of unspecified lacrimal gland: Secondary | ICD-10-CM | POA: Diagnosis not present

## 2023-05-20 ENCOUNTER — Telehealth: Payer: Self-pay

## 2023-05-20 NOTE — Telephone Encounter (Signed)
 Pt called in c/o diarrhea and abd cramping requesting to be seen... I explained to pt regarding the practice's current status and not having any providers available to evaluate pt... I encouraged pt to reach out to PCP as they are capable of ordering labs or imaging to investigate her Sx... Pt expressed understanding

## 2023-05-21 ENCOUNTER — Encounter: Payer: Self-pay | Admitting: Internal Medicine

## 2023-05-21 ENCOUNTER — Ambulatory Visit: Admitting: Internal Medicine

## 2023-05-21 VITALS — BP 112/70 | HR 63 | Ht 63.5 in | Wt 148.0 lb

## 2023-05-21 DIAGNOSIS — R1011 Right upper quadrant pain: Secondary | ICD-10-CM | POA: Diagnosis not present

## 2023-05-21 NOTE — Progress Notes (Signed)
 Date:  05/21/2023   Name:  Shelly Thornton   DOB:  [birth year]   MRN:  [id number]   Chief Complaint: Abdominal Pain (X 1 month, cramping on and off, bloating, no fever, no chills), Diarrhea, and Nausea (No vomiting, just nausea)  Abdominal Pain This is a new problem. The current episode started 1 to 4 weeks ago. The onset quality is gradual. The problem occurs daily. The problem has been unchanged. The pain is moderate. The quality of the pain is cramping and a sensation of fullness. Associated symptoms include diarrhea. Pertinent negatives include no fever, headaches or weight loss.  Diarrhea  This is a recurrent problem. The current episode started 1 to 4 weeks ago. The problem has been unchanged. The stool consistency is described as Watery. The patient states that diarrhea awakens her from sleep. Associated symptoms include abdominal pain. Pertinent negatives include no chills, fever, headaches or weight loss. Exacerbated by: eating. She has tried change of diet and bismuth subsalicylate for the symptoms. The treatment provided moderate relief.    Review of Systems  Constitutional:  Positive for unexpected weight change (may have lost a few pounds). Negative for chills, fever and weight loss.  Respiratory:  Negative for chest tightness and shortness of breath.   Cardiovascular:  Negative for chest pain and palpitations.  Gastrointestinal:  Positive for abdominal pain and diarrhea.  Neurological:  Negative for dizziness, light-headedness and headaches.  Psychiatric/Behavioral:  Negative for dysphoric mood and sleep disturbance. The patient is not nervous/anxious.      Lab Results  Component Value Date   NA 140 02/11/2016   K 4.3 02/11/2016   CO2 28 02/11/2016   GLUCOSE 88 02/11/2016   BUN 18 02/11/2016   CREATININE 0.84 02/11/2016   CALCIUM  9.4 02/11/2016   GFRNONAA 75 02/11/2016   Lab Results  Component Value Date   CHOL 259 (H) 02/11/2016   HDL 105 02/11/2016   LDLCALC 133  (H) 02/11/2016   TRIG 103 02/11/2016   CHOLHDL 2.5 02/11/2016   Lab Results  Component Value Date   TSH 3.490 02/11/2016   No results found for: "HGBA1C" Lab Results  Component Value Date   WBC 7.4 02/11/2016   HGB 13.6 02/11/2016   HCT 40.0 02/11/2016   MCV 92 02/11/2016   PLT 267 02/11/2016   Lab Results  Component Value Date   ALT 19 02/11/2016   AST 20 02/11/2016   ALKPHOS 63 02/11/2016   BILITOT 0.6 02/11/2016   No results found for: "25OHVITD2", "25OHVITD3", "VD25OH"   Patient Active Problem List   Diagnosis Date Noted   History of colonic polyps    Genetic testing 08/03/2020   Family history of carcinoid tumor 07/19/2020   Family history of thyroid cancer 07/19/2020   Adenomatous colon polyp 03/10/2016   HPV (human papilloma virus) infection 02/14/2016   Carpal tunnel syndrome 02/07/2016    Allergies  Allergen Reactions   Penicillins Rash    Past Surgical History:  Procedure Laterality Date   AUGMENTATION MAMMAPLASTY     BREAST ENHANCEMENT SURGERY  1985   COLONOSCOPY  ~2005   @ [facility] - incomplete   COLONOSCOPY WITH PROPOFOL  N/A 03/06/2016   Procedure: COLONOSCOPY WITH biopsy;  Surgeon: Marnee Sink, MD;  Location: [facility];  Service: Endoscopy;  Laterality: N/A;   COLONOSCOPY WITH PROPOFOL  N/A 11/19/2020   Procedure: COLONOSCOPY WITH PROPOFOL ;  Surgeon: Marnee Sink, MD;  [facility];  Service: Endoscopy;  Laterality: N/A;   parathyroid  surgery  2011   POLYPECTOMY  03/06/2016   Procedure: POLYPECTOMY INTESTINAL;  Surgeon: Marnee Sink, MD;  Location: [facility];  Service: Endoscopy;;  Transverse colon polyp   TONSILLECTOMY      Social History   Tobacco Use   Smoking status: Former    Current packs/day: 1.00    Average packs/day: 1 pack/day for 10.0 years (10.0 ttl pk-yrs)    Types: Cigarettes   Smokeless tobacco: Never   Tobacco comments:    Smoking cessation materials not required  Vaping Use   Vaping status:  Never Used  Substance Use Topics   Alcohol use: Yes    Alcohol/week: 0.0 standard drinks of alcohol    Comment: socially   Drug use: No     Medication list has been reviewed and updated.  Current Meds  Medication Sig   albuterol  (VENTOLIN  HFA) 108 (90 Base) MCG/ACT inhaler Inhale 2 puffs into the lungs every 6 (six) hours as needed for wheezing or shortness of breath.   Probiotic Product (PROBIOTIC-10 PO) Take by mouth.       05/21/2023    8:40 AM 09/27/2021    2:04 PM 06/05/2021   10:11 AM 05/29/2020    8:37 AM  GAD 7 : Generalized Anxiety Score  Nervous, Anxious, on Edge 0 0 0 0  Control/stop worrying 0 0 0 0  Worry too much - different things 0 0 0 0  Trouble relaxing 0 0 0 0  Restless 0 0 0 0  Easily annoyed or irritable 0 0 0 0  Afraid - awful might happen 0 0 0 0  Total GAD 7 Score 0 0 0 0  Anxiety Difficulty Not difficult at all Not difficult at all Not difficult at all        05/21/2023    8:40 AM 09/27/2021    1:50 PM 06/05/2021   10:11 AM  Depression screen PHQ 2/9  Decreased Interest 0 0 0  Down, Depressed, Hopeless 0 0 0  PHQ - 2 Score 0 0 0  Altered sleeping 0 0 0  Tired, decreased energy 0 0 0  Change in appetite 0 0 0  Feeling bad or failure about yourself  0 0 0  Trouble concentrating 0 0 0  Moving slowly or fidgety/restless 0 0 0  Suicidal thoughts 0 0 0  PHQ-9 Score 0 0 0  Difficult doing work/chores Not difficult at all Not difficult at all Not difficult at all    BP Readings from Last 3 Encounters:  05/21/23 112/70  09/27/21 110/68  06/05/21 104/60    Physical Exam Vitals and nursing note reviewed.  Constitutional:      General: She is not in acute distress.    Appearance: She is well-developed. She is not ill-appearing.  HENT:     Head: Normocephalic and atraumatic.  Pulmonary:     Effort: Pulmonary effort is normal. No respiratory distress.  Abdominal:     General: Abdomen is flat. Bowel sounds are decreased.     Palpations: Abdomen  is soft.     Tenderness: There is abdominal tenderness in the right upper quadrant. There is no guarding or rebound.  Skin:    General: Skin is warm and dry.     Findings: No rash.  Neurological:     Mental Status: She is alert and oriented to person, place, and time.  Psychiatric:        Mood and Affect: Mood normal.  Behavior: Behavior normal.     Wt Readings from Last 3 Encounters:  05/21/23 148 lb (67.1 kg)  09/27/21 154 lb 9.6 oz (70.1 kg)  06/05/21 150 lb 6.4 oz (68.2 kg)    BP 112/70   Pulse 63   Ht 5' 3.5" (1.613 m)   Wt 148 lb (67.1 kg)   SpO2 99%   BMI 25.81 kg/m   Assessment and Plan:  Problem List Items Addressed This Visit   None Visit Diagnoses       Colicky RUQ abdominal pain    -  Primary   suspect gall bladder etiology recomment low fat, low protein diet for now obtain labs and imaging   Relevant Orders   CBC with Differential/Platelet   Comprehensive metabolic panel with GFR   US  Abdomen Limited RUQ (LIVER/GB)       No follow-ups on file.    Sheron Dixons, MD [facility] [place]

## 2023-05-22 ENCOUNTER — Encounter: Payer: Self-pay | Admitting: Internal Medicine

## 2023-05-22 LAB — CBC WITH DIFFERENTIAL/PLATELET
Basophils Absolute: 0.1 10*3/uL (ref 0.0–0.2)
Basos: 2 %
EOS (ABSOLUTE): 1.3 10*3/uL — ABNORMAL HIGH (ref 0.0–0.4)
Eos: 18 %
Hematocrit: 39.8 % (ref 34.0–46.6)
Hemoglobin: 13.3 g/dL (ref 11.1–15.9)
Immature Grans (Abs): 0 10*3/uL (ref 0.0–0.1)
Immature Granulocytes: 0 %
Lymphocytes Absolute: 2 10*3/uL (ref 0.7–3.1)
Lymphs: 27 %
MCH: 30.8 pg (ref 26.6–33.0)
MCHC: 33.4 g/dL (ref 31.5–35.7)
MCV: 92 fL (ref 79–97)
Monocytes Absolute: 0.5 10*3/uL (ref 0.1–0.9)
Monocytes: 7 %
Neutrophils Absolute: 3.3 10*3/uL (ref 1.4–7.0)
Neutrophils: 46 %
Platelets: 231 10*3/uL (ref 150–450)
RBC: 4.32 x10E6/uL (ref 3.77–5.28)
RDW: 13 % (ref 11.7–15.4)
WBC: 7.1 10*3/uL (ref 3.4–10.8)

## 2023-05-22 LAB — COMPREHENSIVE METABOLIC PANEL WITH GFR
ALT: 12 IU/L (ref 0–32)
AST: 20 IU/L (ref 0–40)
Albumin: 4.6 g/dL (ref 3.9–4.9)
Alkaline Phosphatase: 92 IU/L (ref 44–121)
BUN/Creatinine Ratio: 12 (ref 12–28)
BUN: 9 mg/dL (ref 8–27)
Bilirubin Total: 0.6 mg/dL (ref 0.0–1.2)
CO2: 24 mmol/L (ref 20–29)
Calcium: 9.8 mg/dL (ref 8.7–10.3)
Chloride: 99 mmol/L (ref 96–106)
Creatinine, Ser: 0.74 mg/dL (ref 0.57–1.00)
Globulin, Total: 2.4 g/dL (ref 1.5–4.5)
Glucose: 90 mg/dL (ref 70–99)
Potassium: 4.3 mmol/L (ref 3.5–5.2)
Sodium: 139 mmol/L (ref 134–144)
Total Protein: 7 g/dL (ref 6.0–8.5)
eGFR: 88 mL/min/{1.73_m2} (ref >59)

## 2023-05-26 ENCOUNTER — Other Ambulatory Visit: Payer: Self-pay | Admitting: Internal Medicine

## 2023-05-26 ENCOUNTER — Encounter: Payer: Self-pay | Admitting: Internal Medicine

## 2023-05-26 ENCOUNTER — Ambulatory Visit
Admission: RE | Admit: 2023-05-26 | Discharge: 2023-05-26 | Disposition: A | Discharge status: Home / Self Care | Source: Ambulatory Visit | Attending: Internal Medicine | Admitting: Internal Medicine

## 2023-05-26 DIAGNOSIS — R1011 Right upper quadrant pain: Secondary | ICD-10-CM | POA: Diagnosis present

## 2023-05-26 NOTE — Telephone Encounter (Signed)
 Please review and advise.   JM

## 2023-05-26 NOTE — Progress Notes (Unsigned)
 Date:  05/26/2023   Name:  Shelly Thornton   DOB:  26-Jul-1955   MRN:  [id number]   Chief Complaint: No chief complaint on file.  HPI  Review of Systems   Lab Results  Component Value Date   NA 139 05/21/2023   K 4.3 05/21/2023   CO2 24 05/21/2023   GLUCOSE 90 05/21/2023   BUN 9 05/21/2023   CREATININE 0.74 05/21/2023   CALCIUM  9.8 05/21/2023   EGFR 88 05/21/2023   GFRNONAA 75 02/11/2016   Lab Results  Component Value Date   CHOL 259 (H) 02/11/2016   HDL 105 02/11/2016   LDLCALC 133 (H) 02/11/2016   TRIG 103 02/11/2016   CHOLHDL 2.5 02/11/2016   Lab Results  Component Value Date   TSH 3.490 02/11/2016   No results found for: "HGBA1C" Lab Results  Component Value Date   WBC 7.1 05/21/2023   HGB 13.3 05/21/2023   HCT 39.8 05/21/2023   MCV 92 05/21/2023   PLT 231 05/21/2023   Lab Results  Component Value Date   ALT 12 05/21/2023   AST 20 05/21/2023   ALKPHOS 92 05/21/2023   BILITOT 0.6 05/21/2023   No results found for: "25OHVITD2", "25OHVITD3", "VD25OH"   Patient Active Problem List   Diagnosis Date Noted   History of colonic polyps    Genetic testing 08/03/2020   Family history of carcinoid tumor 07/19/2020   Family history of thyroid cancer 07/19/2020   Adenomatous colon polyp 03/10/2016   HPV (human papilloma virus) infection 02/14/2016   Carpal tunnel syndrome 02/07/2016    Allergies  Allergen Reactions   Penicillins Rash    Past Surgical History:  Procedure Laterality Date   AUGMENTATION MAMMAPLASTY     BREAST ENHANCEMENT SURGERY  1985   COLONOSCOPY  ~2005   @ [facility] - incomplete   COLONOSCOPY WITH PROPOFOL  N/A 03/06/2016   Procedure: COLONOSCOPY WITH biopsy;  Surgeon: Marnee Sink, MD;  Location: [facility];  Service: Endoscopy;  Laterality: N/A;   COLONOSCOPY WITH PROPOFOL  N/A 11/19/2020   Procedure: COLONOSCOPY WITH PROPOFOL ;  Surgeon: Marnee Sink, MD;  Location: [facility];  Service: Endoscopy;  Laterality: N/A;    parathyroid surgery  2011   POLYPECTOMY  03/06/2016   Procedure: POLYPECTOMY INTESTINAL;  Surgeon: Marnee Sink, MD;  Location: [facility];  Service: Endoscopy;;  Transverse colon polyp   TONSILLECTOMY      Social History   Tobacco Use   Smoking status: Former    Current packs/day: 1.00    Average packs/day: 1 pack/day for 10.0 years (10.0 ttl pk-yrs)    Types: Cigarettes   Smokeless tobacco: Never   Tobacco comments:    Smoking cessation materials not required  Vaping Use   Vaping status: Never Used  Substance Use Topics   Alcohol use: Yes    Alcohol/week: 0.0 standard drinks of alcohol    Comment: socially   Drug use: No     Medication list has been reviewed and updated.  No outpatient medications have been marked as taking for the 05/26/23 encounter (Orders Only) with Sheron Dixons, MD.       05/21/2023    8:40 AM 09/27/2021    2:04 PM 06/05/2021   10:11 AM 05/29/2020    8:37 AM  GAD 7 : Generalized Anxiety Score  Nervous, Anxious, on Edge 0 0 0 0  Control/stop worrying 0 0 0 0  Worry too much - different things 0 0 0 0  Trouble relaxing 0 0 0 0  Restless 0 0 0 0  Easily annoyed or irritable 0 0 0 0  Afraid - awful might happen 0 0 0 0  Total GAD 7 Score 0 0 0 0  Anxiety Difficulty Not difficult at all Not difficult at all Not difficult at all        05/21/2023    8:40 AM 09/27/2021    1:50 PM 06/05/2021   10:11 AM  Depression screen PHQ 2/9  Decreased Interest 0 0 0  Down, Depressed, Hopeless 0 0 0  PHQ - 2 Score 0 0 0  Altered sleeping 0 0 0  Tired, decreased energy 0 0 0  Change in appetite 0 0 0  Feeling bad or failure about yourself  0 0 0  Trouble concentrating 0 0 0  Moving slowly or fidgety/restless 0 0 0  Suicidal thoughts 0 0 0  PHQ-9 Score 0 0 0  Difficult doing work/chores Not difficult at all Not difficult at all Not difficult at all    BP Readings from Last 3 Encounters:  05/21/23 112/70  09/27/21 110/68  06/05/21 104/60     Physical Exam  Wt Readings from Last 3 Encounters:  05/21/23 148 lb (67.1 kg)  09/27/21 154 lb 9.6 oz (70.1 kg)  06/05/21 150 lb 6.4 oz (68.2 kg)    There were no vitals taken for this visit.  Assessment and Plan:  Problem List Items Addressed This Visit   None   No follow-ups on file.    Sheron Dixons, MD [facility] [place]

## 2023-05-27 NOTE — Telephone Encounter (Signed)
 Please review.  KP

## 2023-05-28 ENCOUNTER — Other Ambulatory Visit: Payer: Self-pay | Admitting: Internal Medicine

## 2023-05-28 NOTE — Progress Notes (Unsigned)
 Date:  05/28/2023   Name:  Shelly Thornton   DOB:  05-28-1955   MRN:  [id number]   Chief Complaint: No chief complaint on file.  HPI  Review of Systems   Lab Results  Component Value Date   NA 139 05/21/2023   K 4.3 05/21/2023   CO2 24 05/21/2023   GLUCOSE 90 05/21/2023   BUN 9 05/21/2023   CREATININE 0.74 05/21/2023   CALCIUM  9.8 05/21/2023   EGFR 88 05/21/2023   GFRNONAA 75 02/11/2016   Lab Results  Component Value Date   CHOL 259 (H) 02/11/2016   HDL 105 02/11/2016   LDLCALC 133 (H) 02/11/2016   TRIG 103 02/11/2016   CHOLHDL 2.5 02/11/2016   Lab Results  Component Value Date   TSH 3.490 02/11/2016   No results found for: "HGBA1C" Lab Results  Component Value Date   WBC 7.1 05/21/2023   HGB 13.3 05/21/2023   HCT 39.8 05/21/2023   MCV 92 05/21/2023   PLT 231 05/21/2023   Lab Results  Component Value Date   ALT 12 05/21/2023   AST 20 05/21/2023   ALKPHOS 92 05/21/2023   BILITOT 0.6 05/21/2023   No results found for: "25OHVITD2", "25OHVITD3", "VD25OH"   Patient Active Problem List   Diagnosis Date Noted   History of colonic polyps    Genetic testing 08/03/2020   Family history of carcinoid tumor 07/19/2020   Family history of thyroid cancer 07/19/2020   Adenomatous colon polyp 03/10/2016   HPV (human papilloma virus) infection 02/14/2016   Carpal tunnel syndrome 02/07/2016    Allergies  Allergen Reactions   Penicillins Rash    Past Surgical History:  Procedure Laterality Date   AUGMENTATION MAMMAPLASTY     BREAST ENHANCEMENT SURGERY  1985   COLONOSCOPY  ~2005   @ [facility] - incomplete   COLONOSCOPY WITH PROPOFOL  N/A 03/06/2016   Procedure: COLONOSCOPY WITH biopsy;  Surgeon: Marnee Sink, MD;  Location: [facility];  Service: Endoscopy;  Laterality: N/A;   COLONOSCOPY WITH PROPOFOL  N/A 11/19/2020   Procedure: COLONOSCOPY WITH PROPOFOL ;  Surgeon: Marnee Sink, MD;  Location: [facility];  Service: Endoscopy;  Laterality: N/A;    parathyroid surgery  2011   POLYPECTOMY  03/06/2016   Procedure: POLYPECTOMY INTESTINAL;  Surgeon: Marnee Sink, MD;  Location: [facility];  Service: Endoscopy;;  Transverse colon polyp   TONSILLECTOMY      Social History   Tobacco Use   Smoking status: Former    Current packs/day: 1.00    Average packs/day: 1 pack/day for 10.0 years (10.0 ttl pk-yrs)    Types: Cigarettes   Smokeless tobacco: Never   Tobacco comments:    Smoking cessation materials not required  Vaping Use   Vaping status: Never Used  Substance Use Topics   Alcohol use: Yes    Alcohol/week: 0.0 standard drinks of alcohol    Comment: socially   Drug use: No     Medication list has been reviewed and updated.  No outpatient medications have been marked as taking for the 05/28/23 encounter (Orders Only) with Sheron Dixons, MD.       05/21/2023    8:40 AM 09/27/2021    2:04 PM 06/05/2021   10:11 AM 05/29/2020    8:37 AM  GAD 7 : Generalized Anxiety Score  Nervous, Anxious, on Edge 0 0 0 0  Control/stop worrying 0 0 0 0  Worry too much - different things 0 0 0 0  Trouble relaxing 0 0 0 0  Restless 0 0 0 0  Easily annoyed or irritable 0 0 0 0  Afraid - awful might happen 0 0 0 0  Total GAD 7 Score 0 0 0 0  Anxiety Difficulty Not difficult at all Not difficult at all Not difficult at all        05/21/2023    8:40 AM 09/27/2021    1:50 PM 06/05/2021   10:11 AM  Depression screen PHQ 2/9  Decreased Interest 0 0 0  Down, Depressed, Hopeless 0 0 0  PHQ - 2 Score 0 0 0  Altered sleeping 0 0 0  Tired, decreased energy 0 0 0  Change in appetite 0 0 0  Feeling bad or failure about yourself  0 0 0  Trouble concentrating 0 0 0  Moving slowly or fidgety/restless 0 0 0  Suicidal thoughts 0 0 0  PHQ-9 Score 0 0 0  Difficult doing work/chores Not difficult at all Not difficult at all Not difficult at all    BP Readings from Last 3 Encounters:  05/21/23 112/70  09/27/21 110/68  06/05/21 104/60     Physical Exam  Wt Readings from Last 3 Encounters:  05/21/23 148 lb (67.1 kg)  09/27/21 154 lb 9.6 oz (70.1 kg)  06/05/21 150 lb 6.4 oz (68.2 kg)    There were no vitals taken for this visit.  Assessment and Plan:  Problem List Items Addressed This Visit   None   No follow-ups on file.    Sheron Dixons, MD [facility] [place]

## 2023-05-28 NOTE — Telephone Encounter (Signed)
 PT response.  JM

## 2023-09-14 DIAGNOSIS — K591 Functional diarrhea: Secondary | ICD-10-CM | POA: Diagnosis not present

## 2023-09-14 DIAGNOSIS — R1084 Generalized abdominal pain: Secondary | ICD-10-CM | POA: Diagnosis not present
